# Patient Record
Sex: Female | Born: 1985 | Race: White | Hispanic: No | Marital: Married | Smoking: Current every day smoker
Health system: Southern US, Community
[De-identification: ages and names within clinical notes are randomized; demographics above are authoritative.]

## PROBLEM LIST (undated history)

## (undated) DIAGNOSIS — N390 Urinary tract infection, site not specified: Secondary | ICD-10-CM

## (undated) DIAGNOSIS — F329 Major depressive disorder, single episode, unspecified: Secondary | ICD-10-CM

## (undated) DIAGNOSIS — Z8742 Personal history of other diseases of the female genital tract: Secondary | ICD-10-CM

## (undated) DIAGNOSIS — F32A Depression, unspecified: Secondary | ICD-10-CM

## (undated) HISTORY — DX: Urinary tract infection, site not specified: N39.0

## (undated) HISTORY — PX: WISDOM TOOTH EXTRACTION: SHX21

## (undated) HISTORY — DX: Depression, unspecified: F32.A

## (undated) HISTORY — DX: Major depressive disorder, single episode, unspecified: F32.9

## (undated) HISTORY — DX: Personal history of other diseases of the female genital tract: Z87.42

## (undated) HISTORY — PX: HAND SURGERY: SHX662

## (undated) HISTORY — PX: TONSILLECTOMY: SUR1361

---

## 2003-01-09 HISTORY — PX: HAND SURGERY: SHX662

## 2005-01-16 ENCOUNTER — Emergency Department (HOSPITAL_COMMUNITY): Admission: EM | Admit: 2005-01-16 | Discharge: 2005-01-16 | Payer: Self-pay | Admitting: Emergency Medicine

## 2007-01-09 HISTORY — PX: OTHER SURGICAL HISTORY: SHX169

## 2008-04-15 ENCOUNTER — Emergency Department (HOSPITAL_COMMUNITY): Admission: EM | Admit: 2008-04-15 | Discharge: 2008-04-15 | Payer: Self-pay | Admitting: Emergency Medicine

## 2008-07-17 ENCOUNTER — Emergency Department (HOSPITAL_COMMUNITY): Admission: EM | Admit: 2008-07-17 | Discharge: 2008-07-17 | Payer: Self-pay | Admitting: Emergency Medicine

## 2009-01-22 ENCOUNTER — Ambulatory Visit (HOSPITAL_BASED_OUTPATIENT_CLINIC_OR_DEPARTMENT_OTHER): Admission: RE | Admit: 2009-01-22 | Discharge: 2009-01-22 | Payer: Self-pay | Admitting: Nurse Practitioner

## 2009-01-22 ENCOUNTER — Ambulatory Visit: Payer: Self-pay | Admitting: Diagnostic Radiology

## 2009-03-31 ENCOUNTER — Emergency Department (HOSPITAL_BASED_OUTPATIENT_CLINIC_OR_DEPARTMENT_OTHER): Admission: EM | Admit: 2009-03-31 | Discharge: 2009-03-31 | Payer: Self-pay | Admitting: Emergency Medicine

## 2009-03-31 ENCOUNTER — Ambulatory Visit: Payer: Self-pay | Admitting: Radiology

## 2010-04-16 LAB — URINE CULTURE

## 2010-04-16 LAB — COMPREHENSIVE METABOLIC PANEL
BUN: 8 mg/dL (ref 6–23)
Calcium: 8.4 mg/dL (ref 8.4–10.5)
Creatinine, Ser: 0.82 mg/dL (ref 0.4–1.2)
Glucose, Bld: 105 mg/dL — ABNORMAL HIGH (ref 70–99)
Sodium: 140 mEq/L (ref 135–145)
Total Protein: 6.4 g/dL (ref 6.0–8.3)

## 2010-04-16 LAB — URINALYSIS, ROUTINE W REFLEX MICROSCOPIC
Specific Gravity, Urine: 1.025 (ref 1.005–1.030)
pH: 6.5 (ref 5.0–8.0)

## 2010-04-16 LAB — LIPASE, BLOOD: Lipase: 29 U/L (ref 11–59)

## 2010-04-16 LAB — URINE MICROSCOPIC-ADD ON

## 2010-04-17 LAB — HIV ANTIBODY (ROUTINE TESTING W REFLEX): HIV: NONREACTIVE

## 2010-04-17 LAB — ANTIBODY SCREEN: Antibody Screen: NEGATIVE

## 2010-04-17 LAB — GC/CHLAMYDIA PROBE AMP, GENITAL: Gonorrhea: NEGATIVE

## 2010-04-17 LAB — ABO/RH

## 2010-04-17 LAB — RPR: RPR: NONREACTIVE

## 2010-10-04 ENCOUNTER — Other Ambulatory Visit (HOSPITAL_COMMUNITY): Payer: Self-pay | Admitting: Obstetrics and Gynecology

## 2010-10-04 DIAGNOSIS — IMO0001 Reserved for inherently not codable concepts without codable children: Secondary | ICD-10-CM

## 2010-10-04 DIAGNOSIS — IMO0002 Reserved for concepts with insufficient information to code with codable children: Secondary | ICD-10-CM

## 2010-10-05 ENCOUNTER — Ambulatory Visit (HOSPITAL_COMMUNITY)
Admission: RE | Admit: 2010-10-05 | Discharge: 2010-10-05 | Disposition: A | Discharge status: Home / Self Care | Payer: Medicaid Other | Source: Ambulatory Visit | Attending: Obstetrics and Gynecology | Admitting: Obstetrics and Gynecology

## 2010-10-05 ENCOUNTER — Encounter (HOSPITAL_COMMUNITY): Payer: Self-pay

## 2010-10-05 ENCOUNTER — Inpatient Hospital Stay (HOSPITAL_COMMUNITY)
Admission: AD | Admit: 2010-10-05 | Discharge: 2010-10-05 | Disposition: A | Discharge status: Home / Self Care | Payer: Medicaid Other | Source: Ambulatory Visit | Attending: Obstetrics and Gynecology | Admitting: Obstetrics and Gynecology

## 2010-10-05 ENCOUNTER — Other Ambulatory Visit (HOSPITAL_COMMUNITY): Payer: Self-pay | Admitting: Obstetrics and Gynecology

## 2010-10-05 DIAGNOSIS — IMO0002 Reserved for concepts with insufficient information to code with codable children: Secondary | ICD-10-CM

## 2010-10-05 DIAGNOSIS — O365990 Maternal care for other known or suspected poor fetal growth, unspecified trimester, not applicable or unspecified: Secondary | ICD-10-CM | POA: Insufficient documentation

## 2010-10-05 DIAGNOSIS — IMO0001 Reserved for inherently not codable concepts without codable children: Secondary | ICD-10-CM

## 2010-10-05 DIAGNOSIS — O30009 Twin pregnancy, unspecified number of placenta and unspecified number of amniotic sacs, unspecified trimester: Secondary | ICD-10-CM | POA: Insufficient documentation

## 2010-10-05 DIAGNOSIS — O99330 Smoking (tobacco) complicating pregnancy, unspecified trimester: Secondary | ICD-10-CM | POA: Insufficient documentation

## 2010-10-05 NOTE — Discharge Instructions (Signed)
Preterm Labor, Home Care  °Preterm labor is defined as having uterine contractions that cause the cervix to open (dilate), shorten and thin (effacement) before completing 37 weeks of pregnancy. Preterm labor accounts for most hospital admissions in pregnant women.  °CAUSES °· Most cases of preterm labor are unknown.  °· Small areas of separation of the placenta (abruption).  °· Excess fluid in the amniotic sac (poly hydramnios).  °· Twins or more.  °· The cervix cannot hold the baby because the tissue in the cervix is too weak (incompetent cervix).  °· Hormone changes.  °· Vaginal bleeding in more than one of the trimesters.  °· Infection of the cervix, vagina or bladder.  °· Smoking.  °· Antiphosolipid Syndrome. This happens when antibodies affect the protein in the body.  °DIAGNOSIS °Factors that help predict preterm labor: °· History of preterm labor with a past pregnancy.  °· Bacterial vaginosis in women who previously had preterm labor.  °· Home uterine activity monitoring that show uterine contractions.  °· Fetal fibronectin protein that is elevated in women with previous history of preterm labor.  °· Ultrasound to measure the length of the cervix, if it shows signs of shortening before the due date, it may be a sign of preterm labor.  °· Using the fibronectin and cervical ultrasound evaluation together is more predictive of impending preterm labor.  °· Other risk factors include:  °· Nonwhite race.  °· Pregnancy in a 17 year old or younger.  °· Pregnancy in a 35 year old or older.  °· Low socioeconomic factors.  °· Low weight gain during the pregnancy.  °PREVENTION °Not all preterm labor can be prevented. Some early contractions can be prevented with simple measures. °· Drink fluids. Drink eight, 8 ounce glasses of fluids per day. Preterm labor rates go up in the summer months. Dehydration makes the blood volume decrease. This increases the concentration of oxytocin (hormone that causes uterine contractions)  in the blood. Hydrating yourself helps prevent this build up.  °· Watch for signs of infection. Signs include burning during urination, increased need to urinate, abnormal vaginal discharge or unexplained fevers.  °· Keep your appointments with your caregiver. Call your caregiver right away if you think you are having uterine contractions.  °· Seek medical advice with questions or problems. It is much better to ask questions of your caregiver than to be in untreated preterm labor unknowingly.  °MANAGEMENT OF PRETERM LABOR, IN & OUT OF THE HOSPITAL °There are a lot of things to manage in preterm labor. These things include both medical measures and personal care measures for you and/or your baby. Most preterm labor will be handled in the hospital. Things that may be helpful in preterm labor include: °· Hydration (oral or IV). Take in eight, 8 ounce glasses of water per day.  °· Bed rest (home or hospital). Lying on your left side may help.  °· Avoid intercourse and orgasms.  °· Medication (antibiotics) to help prevent infection. This is more likely if your membranes have ruptured or if the contractions are caused by infection. Take medications as directed.  °· Evaluation of your baby. These tests or procedures help the caregiver know how the baby is doing and may do in the case of an early birth. Including:  °· Biophysical profile.  °· Non-stress or stress tests.  °· Amniocentesis to evaluate the baby for fetal lung maturity.  °· Amniotic fluid volume index (AFI).  °· An ultrasound.  °· Medications (steroids) to   help your baby's lungs mature more quickly may be used. This may happen if preterm birth cannot be stopped.  °· Tocolytic medications (medications that help stop uterine contractions) may help prolong the pregnancy up to 7 days. This is helpful if steroids medication is needed to help the baby’s lungs mature.  °· Your caregiver may give other advice on preparation for preterm birth.  °· Progesterone may be  beneficial in some cases of preterm labor.  °TREATMENT °The best treatment is prevention, being aware of risk factors and early detection. Make sure to ask your caregiver to discuss with you the signs and symptoms of preterm labor, especially if you had preterm labor with a previous pregnancy. °HOME CARE INSTRUCTIONS °· Eat a balanced and nourished diet.  °· Take your vitamin supplements as directed.  °· Drink 6 to 8 glasses of liquids a day.  °· Get plenty of rest and sleep.  °· Do not have sexual relations if you have preterm labor or are at high risk of having preterm labor.  °· Follow your care giver’s recommendation regarding activities, medications, blood and other tests (ultrasound, amniocentesis, etc.).  °· Avoid stress.  °· Avoid hard labor or prolonged exercise if you are at high risk for preterm labor.  °· Do not smoke.  °SEEK IMMEDIATE MEDICAL CARE IF: °· You are having contractions.  °· You have abdominal pain.  °· You have vaginal bleeding.  °· You have painful urination.  °· You have abnormal discharge.  °· You develop a temperature 102° F (38.9° C) or higher.  °Document Released: 12/25/2004 Document Re-Released: 03/21/2009 °[employer]® Patient Information ©2011 [employer]. ° ° °

## 2010-10-05 NOTE — Progress Notes (Signed)
Patient seen for ultrasound appointment today.  Please see [facility] report for details.  

## 2010-10-09 ENCOUNTER — Ambulatory Visit (HOSPITAL_COMMUNITY)
Admission: RE | Admit: 2010-10-09 | Discharge: 2010-10-09 | Disposition: A | Discharge status: Home / Self Care | Payer: Medicaid Other | Source: Ambulatory Visit | Attending: Obstetrics and Gynecology | Admitting: Obstetrics and Gynecology

## 2010-10-09 DIAGNOSIS — O365990 Maternal care for other known or suspected poor fetal growth, unspecified trimester, not applicable or unspecified: Secondary | ICD-10-CM | POA: Insufficient documentation

## 2010-10-09 DIAGNOSIS — O30049 Twin pregnancy, dichorionic/diamniotic, unspecified trimester: Secondary | ICD-10-CM | POA: Insufficient documentation

## 2010-10-09 DIAGNOSIS — IMO0002 Reserved for concepts with insufficient information to code with codable children: Secondary | ICD-10-CM

## 2010-10-09 DIAGNOSIS — O30009 Twin pregnancy, unspecified number of placenta and unspecified number of amniotic sacs, unspecified trimester: Secondary | ICD-10-CM | POA: Insufficient documentation

## 2010-10-09 DIAGNOSIS — O99330 Smoking (tobacco) complicating pregnancy, unspecified trimester: Secondary | ICD-10-CM | POA: Insufficient documentation

## 2010-10-09 NOTE — Progress Notes (Signed)
Encounter addended by: Marlana Latus, RN on: 10/09/2010 11:51 AM<BR>     Documentation filed: Normajean Glasgow VN

## 2010-10-09 NOTE — Progress Notes (Signed)
Twins:  A - NST reactive B - NST reactive

## 2010-10-09 NOTE — Progress Notes (Signed)
Ultrasound in [facility]/EPIC.  Follow up U/S scheduled 

## 2010-10-09 NOTE — Progress Notes (Signed)
Encounter addended by: Marlana Latus, RN on: 10/09/2010 11:18 AM<BR>     Documentation filed: Charges VN

## 2010-10-10 ENCOUNTER — Telehealth (HOSPITAL_COMMUNITY): Payer: Self-pay | Admitting: *Deleted

## 2010-10-10 ENCOUNTER — Encounter (HOSPITAL_COMMUNITY): Payer: Self-pay | Admitting: *Deleted

## 2010-10-10 NOTE — Telephone Encounter (Signed)
Preadmission screen  

## 2010-10-12 ENCOUNTER — Inpatient Hospital Stay (HOSPITAL_COMMUNITY): Payer: Medicaid Other

## 2010-10-12 ENCOUNTER — Other Ambulatory Visit: Payer: Self-pay | Admitting: Obstetrics and Gynecology

## 2010-10-12 ENCOUNTER — Encounter (HOSPITAL_COMMUNITY): Payer: Self-pay

## 2010-10-12 ENCOUNTER — Encounter (HOSPITAL_COMMUNITY)
Admission: RE | Disposition: A | Discharge status: Home / Self Care | Payer: Self-pay | Source: Ambulatory Visit | Attending: Obstetrics and Gynecology

## 2010-10-12 ENCOUNTER — Inpatient Hospital Stay (HOSPITAL_COMMUNITY)
Admission: RE | Admit: 2010-10-12 | Discharge: 2010-10-15 | DRG: 765 | Disposition: A | Discharge status: Home / Self Care | Payer: Medicaid Other | Source: Ambulatory Visit | Attending: Obstetrics and Gynecology | Admitting: Obstetrics and Gynecology

## 2010-10-12 ENCOUNTER — Inpatient Hospital Stay (HOSPITAL_COMMUNITY): Payer: Medicaid Other | Admitting: Anesthesiology

## 2010-10-12 ENCOUNTER — Encounter (HOSPITAL_COMMUNITY): Payer: Self-pay | Admitting: Anesthesiology

## 2010-10-12 ENCOUNTER — Ambulatory Visit (HOSPITAL_COMMUNITY): Payer: Medicaid Other

## 2010-10-12 DIAGNOSIS — O30049 Twin pregnancy, dichorionic/diamniotic, unspecified trimester: Secondary | ICD-10-CM

## 2010-10-12 DIAGNOSIS — IMO0002 Reserved for concepts with insufficient information to code with codable children: Secondary | ICD-10-CM

## 2010-10-12 DIAGNOSIS — O3090 Multiple gestation, unspecified, unspecified trimester: Principal | ICD-10-CM | POA: Diagnosis present

## 2010-10-12 DIAGNOSIS — O368190 Decreased fetal movements, unspecified trimester, not applicable or unspecified: Secondary | ICD-10-CM | POA: Diagnosis present

## 2010-10-12 DIAGNOSIS — O30009 Twin pregnancy, unspecified number of placenta and unspecified number of amniotic sacs, unspecified trimester: Secondary | ICD-10-CM | POA: Diagnosis present

## 2010-10-12 DIAGNOSIS — O365990 Maternal care for other known or suspected poor fetal growth, unspecified trimester, not applicable or unspecified: Secondary | ICD-10-CM | POA: Diagnosis present

## 2010-10-12 LAB — CBC
HCT: 39.5 % (ref 36.0–46.0)
MCHC: 34.4 g/dL (ref 30.0–36.0)
RDW: 14.1 % (ref 11.5–15.5)

## 2010-10-12 SURGERY — Surgical Case
Anesthesia: Spinal | Site: Abdomen | Wound class: Clean Contaminated

## 2010-10-12 MED ORDER — ONDANSETRON HCL 4 MG/2ML IJ SOLN
INTRAMUSCULAR | Status: DC | PRN
  Administered 2010-10-12: 4 mg via INTRAVENOUS

## 2010-10-12 MED ORDER — CEFAZOLIN SODIUM 1-5 GM-% IV SOLN
INTRAVENOUS | Status: AC
  Filled 2010-10-12: qty 100

## 2010-10-12 MED ORDER — ONDANSETRON HCL 4 MG/2ML IJ SOLN: 4.0000 mg | Freq: Four times a day (QID) | INTRAMUSCULAR | Status: DC | PRN

## 2010-10-12 MED ORDER — LACTATED RINGERS IV SOLN
INTRAVENOUS | Status: DC
  Administered 2010-10-12 (×3): via INTRAVENOUS
  Administered 2010-10-13: 1000 mL via INTRAVENOUS

## 2010-10-12 MED ORDER — FLEET ENEMA 7-19 GM/118ML RE ENEM: 1.0000 | ENEMA | RECTAL | Status: DC | PRN

## 2010-10-12 MED ORDER — PHENYLEPHRINE HCL 10 MG/ML IJ SOLN
INTRAMUSCULAR | Status: DC | PRN
  Administered 2010-10-12 (×5): 80 ug via INTRAVENOUS

## 2010-10-12 MED ORDER — OXYTOCIN 20 UNITS IN LACTATED RINGERS INFUSION - SIMPLE: 125.0000 mL/h | Freq: Once | INTRAVENOUS | Status: DC

## 2010-10-12 MED ORDER — CEFAZOLIN SODIUM 1-5 GM-% IV SOLN
INTRAVENOUS | Status: DC | PRN
  Administered 2010-10-12: 2 g via INTRAVENOUS

## 2010-10-12 MED ORDER — FENTANYL CITRATE 0.05 MG/ML IJ SOLN
INTRAMUSCULAR | Status: DC | PRN
  Administered 2010-10-12: 15 ug via INTRATHECAL

## 2010-10-12 MED ORDER — BUPIVACAINE IN DEXTROSE 0.75-8.25 % IT SOLN
INTRATHECAL | Status: DC | PRN
  Administered 2010-10-12: 1.5 mL via INTRATHECAL

## 2010-10-12 MED ORDER — LIDOCAINE HCL (PF) 1 % IJ SOLN
30.0000 mL | INTRAMUSCULAR | Status: DC | PRN
  Filled 2010-10-12: qty 30

## 2010-10-12 MED ORDER — MEPERIDINE HCL 25 MG/ML IJ SOLN
INTRAMUSCULAR | Status: DC | PRN
  Administered 2010-10-12 (×3): 6 mg via INTRAVENOUS
  Administered 2010-10-12: 7 mg via INTRAVENOUS

## 2010-10-12 MED ORDER — MORPHINE SULFATE (PF) 0.5 MG/ML IJ SOLN
INTRAMUSCULAR | Status: DC | PRN
  Administered 2010-10-12: .9 mg via EPIDURAL
  Administered 2010-10-12 – 2010-10-13 (×4): 1 mg via EPIDURAL

## 2010-10-12 MED ORDER — OXYCODONE-ACETAMINOPHEN 5-325 MG PO TABS
2.0000 | ORAL_TABLET | ORAL | Status: DC | PRN
  Administered 2010-10-13 – 2010-10-15 (×11): 2 via ORAL
  Filled 2010-10-12 (×13): qty 2

## 2010-10-12 MED ORDER — MEPERIDINE HCL 25 MG/ML IJ SOLN
INTRAMUSCULAR | Status: AC
  Filled 2010-10-12: qty 1

## 2010-10-12 MED ORDER — ACETAMINOPHEN 325 MG PO TABS: 650.0000 mg | ORAL_TABLET | ORAL | Status: DC | PRN

## 2010-10-12 MED ORDER — OXYTOCIN 10 UNIT/ML IJ SOLN
INTRAMUSCULAR | Status: AC
  Filled 2010-10-12: qty 4

## 2010-10-12 MED ORDER — CITRIC ACID-SODIUM CITRATE 334-500 MG/5ML PO SOLN
30.0000 mL | ORAL | Status: DC | PRN
  Administered 2010-10-12: 30 mL via ORAL
  Filled 2010-10-12: qty 15

## 2010-10-12 MED ORDER — MORPHINE SULFATE (PF) 0.5 MG/ML IJ SOLN
INTRAMUSCULAR | Status: DC | PRN
  Administered 2010-10-12: .1 mg via INTRATHECAL

## 2010-10-12 MED ORDER — LACTATED RINGERS IV SOLN: 500.0000 mL | INTRAVENOUS | Status: DC | PRN

## 2010-10-12 MED ORDER — OXYTOCIN 20 UNITS IN LACTATED RINGERS INFUSION - SIMPLE
INTRAVENOUS | Status: DC | PRN
  Administered 2010-10-12 (×2): 20 [IU] via INTRAVENOUS

## 2010-10-12 MED ORDER — MORPHINE SULFATE 0.5 MG/ML IJ SOLN
INTRAMUSCULAR | Status: AC
  Filled 2010-10-12: qty 10

## 2010-10-12 MED ORDER — ONDANSETRON HCL 4 MG/2ML IJ SOLN
INTRAMUSCULAR | Status: AC
  Filled 2010-10-12: qty 2

## 2010-10-12 MED ORDER — FENTANYL CITRATE 0.05 MG/ML IJ SOLN
INTRAMUSCULAR | Status: AC
  Filled 2010-10-12: qty 2

## 2010-10-12 MED ORDER — FENTANYL CITRATE 0.05 MG/ML IJ SOLN
INTRAMUSCULAR | Status: DC | PRN
  Administered 2010-10-12: 35 ug via INTRAVENOUS
  Administered 2010-10-12 (×2): 25 ug via INTRAVENOUS

## 2010-10-12 MED ORDER — IBUPROFEN 600 MG PO TABS
600.0000 mg | ORAL_TABLET | Freq: Four times a day (QID) | ORAL | Status: DC | PRN
  Filled 2010-10-12 (×4): qty 1

## 2010-10-12 MED ORDER — OXYTOCIN BOLUS FROM INFUSION
500.0000 mL | Freq: Once | INTRAVENOUS | Status: DC
  Filled 2010-10-12: qty 500

## 2010-10-12 SURGICAL SUPPLY — 40 items
ADH SKN CLS APL DERMABOND .7 (GAUZE/BANDAGES/DRESSINGS)
CLOTH BEACON ORANGE TIMEOUT ST (SAFETY) ×2 IMPLANT
DERMABOND ADVANCED (GAUZE/BANDAGES/DRESSINGS)
DERMABOND ADVANCED .7 DNX12 (GAUZE/BANDAGES/DRESSINGS) IMPLANT
DRESSING TELFA 8X3 (GAUZE/BANDAGES/DRESSINGS) ×1 IMPLANT
DURAPREP 26ML APPLICATOR (WOUND CARE) ×2 IMPLANT
ELECT REM PT RETURN 9FT ADLT (ELECTROSURGICAL) ×2
ELECTRODE REM PT RTRN 9FT ADLT (ELECTROSURGICAL) ×1 IMPLANT
EXTRACTOR VACUUM M CUP 4 TUBE (SUCTIONS) IMPLANT
GAUZE SPONGE 4X4 12PLY STRL LF (GAUZE/BANDAGES/DRESSINGS) IMPLANT
GLOVE BIO SURGEON STRL SZ 6.5 (GLOVE) ×2 IMPLANT
GLOVE BIO SURGEON STRL SZ7 (GLOVE) ×1 IMPLANT
GLOVE BIOGEL PI IND STRL 7.0 (GLOVE) ×1 IMPLANT
GLOVE BIOGEL PI IND STRL 7.5 (GLOVE) IMPLANT
GLOVE BIOGEL PI IND STRL 8.5 (GLOVE) IMPLANT
GLOVE BIOGEL PI INDICATOR 7.0 (GLOVE) ×1
GLOVE BIOGEL PI INDICATOR 7.5 (GLOVE) ×1
GLOVE BIOGEL PI INDICATOR 8.5 (GLOVE) ×1
GLOVE INDICATOR 7.0 STRL GRN (GLOVE) ×1 IMPLANT
GOWN PREVENTION PLUS LG XLONG (DISPOSABLE) ×4 IMPLANT
GOWN PREVENTION PLUS XLARGE (GOWN DISPOSABLE) ×2 IMPLANT
KIT ABG SYR 3ML LUER SLIP (SYRINGE) IMPLANT
NDL HYPO 25X5/8 SAFETYGLIDE (NEEDLE) IMPLANT
NEEDLE HYPO 25X5/8 SAFETYGLIDE (NEEDLE) IMPLANT
NS IRRIG 1000ML POUR BTL (IV SOLUTION) ×3 IMPLANT
PACK C SECTION WH (CUSTOM PROCEDURE TRAY) ×2 IMPLANT
PAD ABD 7.5X8 STRL (GAUZE/BANDAGES/DRESSINGS) ×1 IMPLANT
RTRCTR C-SECT PINK 25CM LRG (MISCELLANEOUS) ×1 IMPLANT
SLEEVE SCD COMPRESS KNEE MED (MISCELLANEOUS) ×1 IMPLANT
SPONGE GAUZE 4X4 12PLY (GAUZE/BANDAGES/DRESSINGS) ×1 IMPLANT
SUT CHROMIC 2 0 CT 1 (SUTURE) ×3 IMPLANT
SUT PLAIN 2 0 (SUTURE)
SUT PLAIN 2 0 XLH (SUTURE) ×2 IMPLANT
SUT PLAIN ABS 2-0 54XMFL TIE (SUTURE) IMPLANT
SUT VIC AB 0 CT1 36 (SUTURE) ×8 IMPLANT
SUT VIC AB 4-0 KS 27 (SUTURE) ×2 IMPLANT
TAPE CLOTH SURG 4X10 WHT LF (GAUZE/BANDAGES/DRESSINGS) ×1 IMPLANT
TOWEL OR 17X24 6PK STRL BLUE (TOWEL DISPOSABLE) ×4 IMPLANT
TRAY FOLEY CATH 14FR (SET/KITS/TRAYS/PACK) ×1 IMPLANT
WATER STERILE IRR 1000ML POUR (IV SOLUTION) ×2 IMPLANT

## 2010-10-12 NOTE — Plan of Care (Signed)
Pt is waiting on induction of labor in birthing suites.

## 2010-10-12 NOTE — Progress Notes (Signed)
Pt is scheduled for an induction of labor. Unable to get ot BS at this time. Pt is very concerned about her babies(twins) and brought into [facility] for NST>

## 2010-10-12 NOTE — Progress Notes (Signed)
Dr. Neva Seat at bedside, discussing pt plan of care and option of vaginal delivery vs c-section due to baby B being breech. Pt states she feels more comfortable with proceeding with c-section. Dr. Neva Seat explaining risks and benefits of c-section. Pt verbalizes understanding and consents to c-section.

## 2010-10-12 NOTE — H&P (Addendum)
Nicole Bond is a 25 y.o. female presenting for medical  Induction of 36 wk twins, vertex/vertex and IUGR. The patient has had biweekly NSTs, weekly AFI and doppler studies. At 35 weeks IUGR of both twins was diagnosed. MFM consultation recommended delivery at 37weeks.  However after an abnormal doppler flow in one twin and decreased fetal movement, the decision was made for delivery. Risks possible complications of induction versus CS was discussed and informed consent was given. OB History    Grav Para Term Preterm Abortions TAB SAB Ect Mult Living   3 1 1  0 1 0 1 0 0 1     Past Medical History  Diagnosis Date  . No pertinent past medical history   . Depression   . History of ovarian cyst     left  . Frequent UTI    Past Surgical History  Procedure Date  . Tonsillectomy   . Hand surgery     cyst removed from left hand  . Wisdom tooth extraction   . Colon polyps extracted 2009  . Hand surgery 2005   Family History: family history includes Cancer in her paternal grandfather and paternal grandmother.  There is no history of Anesthesia problems, and Hypotension, and Malignant hyperthermia, and Pseudochol deficiency, . Social History:  reports that she has been smoking Cigarettes.  She has a 5 pack-year smoking history. She does not have any smokeless tobacco history on file. She reports that she does not drink alcohol or use illicit drugs.  ROS non contributory    Blood pressure 116/74, pulse 85, temperature 98.2 F (36.8 C), temperature source Oral, resp. rate 20, height 5\' 2"  (1.575 m), weight 104.327 kg (230 lb). Physical exam  Heent nl Heart S1 S2 clear Lungs clear abd term gravid abdomen cx 3 70/ -1 vtx Ext no swelling discoloration   Prenatal labs: ABO, Rh: A/Positive/-- (04/09 0000) Antibody: Negative (04/09 0000) Rubella:   RPR: Nonreactive (04/09 0000)  HBsAg: Negative (04/09 0000)  HIV: Non-reactive (04/09 0000)  GBS:     Assessment/Plan36 wks twins IUGR  P:   Induction of labor   Nicole Bond E 10/12/2010, 6:49 PM    Upon admission an ultrasound was ordered to confirm position vtx/vtx.  US revealed that the presentation was now vtx/breech. In light of this finding a consultion was held with the parents and they requested Cesarean section after the risks and benefits of vaginal birth vs Cesarean section were discussed and informed consent was given.

## 2010-10-12 NOTE — Anesthesia Procedure Notes (Signed)
Spinal Block  Patient location during procedure: OR Start time: 10/12/2010 11:09 PM Staffing Performed by: anesthesiologist  Preanesthetic Checklist Completed: patient identified, site marked, surgical consent, pre-op evaluation, timeout performed, IV checked, risks and benefits discussed and monitors and equipment checked Spinal Block Patient position: sitting Prep: site prepped and draped and DuraPrep Patient monitoring: heart rate, cardiac monitor, continuous pulse ox and blood pressure Approach: midline Location: L3-4 Injection technique: single-shot Needle Needle type: Sprotte  Needle gauge: 24 G Needle length: 9 cm Assessment Sensory level: T4

## 2010-10-12 NOTE — Anesthesia Preprocedure Evaluation (Addendum)
Anesthesia Evaluation  Name, MR# and DOB Patient awake  General Assessment Comment  Reviewed: Allergy & Precautions, H&P , NPO status , Patient's Chart, lab work & pertinent test results, reviewed documented beta blocker date and time   History of Anesthesia Complications Negative for: history of anesthetic complications  Airway Mallampati: II TM Distance: >3 FB Neck ROM: full    Dental  (+) Missing, Partial Upper, Poor Dentition and Partial Lower   Pulmonary Current Smoker  clear to auscultation        Cardiovascular regular Normal    Neuro/Psych PSYCHIATRIC DISORDERS (depression) Depression Negative Neurological ROS     GI/Hepatic Neg liver ROS  GERD   Endo/Other  Morbid obesity  Renal/GU negative Renal ROS  Female GU complaint (frequent uti)     Musculoskeletal   Abdominal   Peds  Hematology negative hematology ROS (+)   Anesthesia Other Findings   Reproductive/Obstetrics (+) Pregnancy (twins - vtx/breech, both IUGR)                        Anesthesia Physical Anesthesia Plan  ASA: II  Anesthesia Plan: Spinal   Post-op Pain Management:    Induction:   Airway Management Planned:   Additional Equipment:   Intra-op Plan:   Post-operative Plan:   Informed Consent: I have reviewed the patients History and Physical, chart, labs and discussed the procedure including the risks, benefits and alternatives for the proposed anesthesia with the patient or authorized representative who has indicated his/her understanding and acceptance.   Dental Advisory Given  Plan Discussed with: CRNA and Surgeon  Anesthesia Plan Comments:         Anesthesia Quick Evaluation

## 2010-10-13 LAB — CBC
MCH: 30.8 pg (ref 26.0–34.0)
MCHC: 34.6 g/dL (ref 30.0–36.0)
MCV: 89 fL (ref 78.0–100.0)
Platelets: 230 10*3/uL (ref 150–400)

## 2010-10-13 LAB — RPR: RPR Ser Ql: NONREACTIVE

## 2010-10-13 MED ORDER — NALBUPHINE SYRINGE 5 MG/0.5 ML
5.0000 mg | INJECTION | INTRAMUSCULAR | Status: DC | PRN
  Filled 2010-10-13: qty 1

## 2010-10-13 MED ORDER — MEPERIDINE HCL 25 MG/ML IJ SOLN
6.2500 mg | INTRAMUSCULAR | Status: AC | PRN
  Administered 2010-10-13 (×2): 6.25 mg via INTRAVENOUS

## 2010-10-13 MED ORDER — DIPHENHYDRAMINE HCL 25 MG PO CAPS: 25.0000 mg | ORAL_CAPSULE | ORAL | Status: DC | PRN

## 2010-10-13 MED ORDER — SIMETHICONE 80 MG PO CHEW
80.0000 mg | CHEWABLE_TABLET | Freq: Three times a day (TID) | ORAL | Status: DC
  Administered 2010-10-13 – 2010-10-15 (×7): 80 mg via ORAL

## 2010-10-13 MED ORDER — INFLUENZA VIRUS VACC SPLIT PF IM SUSP
0.5000 mL | Freq: Once | INTRAMUSCULAR | Status: AC
  Administered 2010-10-13: 0.5 mL via INTRAMUSCULAR
  Filled 2010-10-13: qty 0.5

## 2010-10-13 MED ORDER — PHENYLEPHRINE 40 MCG/ML (10ML) SYRINGE FOR IV PUSH (FOR BLOOD PRESSURE SUPPORT)
PREFILLED_SYRINGE | INTRAVENOUS | Status: AC
  Filled 2010-10-13: qty 5

## 2010-10-13 MED ORDER — IBUPROFEN 600 MG PO TABS
600.0000 mg | ORAL_TABLET | Freq: Four times a day (QID) | ORAL | Status: DC
  Administered 2010-10-13 – 2010-10-15 (×10): 600 mg via ORAL
  Filled 2010-10-13 (×7): qty 1

## 2010-10-13 MED ORDER — LANOLIN HYDROUS EX OINT: 1.0000 "application " | TOPICAL_OINTMENT | CUTANEOUS | Status: DC | PRN

## 2010-10-13 MED ORDER — ZOLPIDEM TARTRATE 5 MG PO TABS: 5.0000 mg | ORAL_TABLET | Freq: Every evening | ORAL | Status: DC | PRN

## 2010-10-13 MED ORDER — HYDROMORPHONE HCL 1 MG/ML IJ SOLN: 0.2500 mg | INTRAMUSCULAR | Status: DC | PRN

## 2010-10-13 MED ORDER — ONDANSETRON HCL 4 MG/2ML IJ SOLN: 4.0000 mg | Freq: Three times a day (TID) | INTRAMUSCULAR | Status: DC | PRN

## 2010-10-13 MED ORDER — TETANUS-DIPHTH-ACELL PERTUSSIS 5-2.5-18.5 LF-MCG/0.5 IM SUSP
0.5000 mL | Freq: Once | INTRAMUSCULAR | Status: AC
  Administered 2010-10-13: 0.5 mL via INTRAMUSCULAR
  Filled 2010-10-13: qty 0.5

## 2010-10-13 MED ORDER — SENNOSIDES-DOCUSATE SODIUM 8.6-50 MG PO TABS
2.0000 | ORAL_TABLET | Freq: Every day | ORAL | Status: DC
  Administered 2010-10-13 – 2010-10-14 (×2): 2 via ORAL

## 2010-10-13 MED ORDER — SIMETHICONE 80 MG PO CHEW: 80.0000 mg | CHEWABLE_TABLET | ORAL | Status: DC | PRN

## 2010-10-13 MED ORDER — MEDROXYPROGESTERONE ACETATE 150 MG/ML IM SUSP: 150.0000 mg | INTRAMUSCULAR | Status: DC | PRN

## 2010-10-13 MED ORDER — KETOROLAC TROMETHAMINE 30 MG/ML IJ SOLN: 30.0000 mg | Freq: Four times a day (QID) | INTRAMUSCULAR | Status: AC | PRN

## 2010-10-13 MED ORDER — DIBUCAINE 1 % RE OINT: 1.0000 "application " | TOPICAL_OINTMENT | RECTAL | Status: DC | PRN

## 2010-10-13 MED ORDER — MENTHOL 3 MG MT LOZG: 1.0000 | LOZENGE | OROMUCOSAL | Status: DC | PRN

## 2010-10-13 MED ORDER — METOCLOPRAMIDE HCL 5 MG/ML IJ SOLN: 10.0000 mg | Freq: Three times a day (TID) | INTRAMUSCULAR | Status: DC | PRN

## 2010-10-13 MED ORDER — LACTATED RINGERS IV SOLN
INTRAVENOUS | Status: DC
  Administered 2010-10-13: 06:00:00 via INTRAVENOUS

## 2010-10-13 MED ORDER — KETOROLAC TROMETHAMINE 60 MG/2ML IM SOLN
60.0000 mg | Freq: Once | INTRAMUSCULAR | Status: AC | PRN
  Administered 2010-10-13: 60 mg via INTRAMUSCULAR

## 2010-10-13 MED ORDER — SODIUM CHLORIDE 0.9 % IV SOLN
1.0000 ug/kg/h | INTRAVENOUS | Status: DC | PRN
  Filled 2010-10-13: qty 2.5

## 2010-10-13 MED ORDER — DIPHENHYDRAMINE HCL 25 MG PO CAPS: 25.0000 mg | ORAL_CAPSULE | Freq: Four times a day (QID) | ORAL | Status: DC | PRN

## 2010-10-13 MED ORDER — MEPERIDINE HCL 25 MG/ML IJ SOLN
INTRAMUSCULAR | Status: AC
  Filled 2010-10-13: qty 1

## 2010-10-13 MED ORDER — OXYTOCIN 20 UNITS IN LACTATED RINGERS INFUSION - SIMPLE: 125.0000 mL/h | INTRAVENOUS | Status: AC

## 2010-10-13 MED ORDER — TETANUS-DIPHTH-ACELL PERTUSSIS 5-2.5-18.5 LF-MCG/0.5 IM SUSP: 0.5000 mL | Freq: Once | INTRAMUSCULAR | Status: DC

## 2010-10-13 MED ORDER — ONDANSETRON HCL 4 MG/2ML IJ SOLN: 4.0000 mg | INTRAMUSCULAR | Status: DC | PRN

## 2010-10-13 MED ORDER — ONDANSETRON HCL 4 MG PO TABS: 4.0000 mg | ORAL_TABLET | ORAL | Status: DC | PRN

## 2010-10-13 MED ORDER — SCOPOLAMINE 1 MG/3DAYS TD PT72
1.0000 | MEDICATED_PATCH | Freq: Once | TRANSDERMAL | Status: DC
  Administered 2010-10-13: 1.5 mg via TRANSDERMAL

## 2010-10-13 MED ORDER — NALOXONE HCL 0.4 MG/ML IJ SOLN: 0.4000 mg | INTRAMUSCULAR | Status: DC | PRN

## 2010-10-13 MED ORDER — SODIUM CHLORIDE 0.9 % IJ SOLN: 3.0000 mL | INTRAMUSCULAR | Status: DC | PRN

## 2010-10-13 MED ORDER — DIPHENHYDRAMINE HCL 50 MG/ML IJ SOLN: 25.0000 mg | INTRAMUSCULAR | Status: DC | PRN

## 2010-10-13 MED ORDER — PRENATAL PLUS 27-1 MG PO TABS
1.0000 | ORAL_TABLET | Freq: Every day | ORAL | Status: DC
  Administered 2010-10-13 – 2010-10-15 (×3): 1 via ORAL
  Filled 2010-10-13 (×3): qty 1

## 2010-10-13 MED ORDER — OXYCODONE-ACETAMINOPHEN 5-325 MG PO TABS
1.0000 | ORAL_TABLET | ORAL | Status: DC | PRN
  Administered 2010-10-14 (×2): 2 via ORAL

## 2010-10-13 MED ORDER — DIPHENHYDRAMINE HCL 50 MG/ML IJ SOLN: 12.5000 mg | INTRAMUSCULAR | Status: DC | PRN

## 2010-10-13 MED ORDER — OXYTOCIN 20 UNITS IN LACTATED RINGERS INFUSION - SIMPLE
INTRAVENOUS | Status: AC
  Filled 2010-10-13: qty 1000

## 2010-10-13 MED ORDER — IBUPROFEN 600 MG PO TABS: 600.0000 mg | ORAL_TABLET | Freq: Four times a day (QID) | ORAL | Status: DC | PRN

## 2010-10-13 MED ORDER — SCOPOLAMINE 1 MG/3DAYS TD PT72
MEDICATED_PATCH | TRANSDERMAL | Status: AC
  Filled 2010-10-13: qty 1

## 2010-10-13 MED ORDER — MEASLES, MUMPS & RUBELLA VAC ~~LOC~~ INJ: 0.5000 mL | INJECTION | Freq: Once | SUBCUTANEOUS | Status: DC

## 2010-10-13 MED ORDER — KETOROLAC TROMETHAMINE 60 MG/2ML IM SOLN
INTRAMUSCULAR | Status: AC
  Filled 2010-10-13: qty 2

## 2010-10-13 MED ORDER — WITCH HAZEL-GLYCERIN EX PADS: 1.0000 "application " | MEDICATED_PAD | CUTANEOUS | Status: DC | PRN

## 2010-10-13 NOTE — Progress Notes (Signed)
Subjective: Postpartum Day 1: Cesarean Delivery Patient reports tolerating PO.    Objective: Vital signs in last 24 hours: Temp:  [97.5 F (36.4 C)-99 F (37.2 C)] 98.7 F (37.1 C) (10/05 1411) Pulse Rate:  [65-90] 76  (10/05 1411) Resp:  [18-32] 18  (10/05 1411) BP: (92-132)/(30-87) 113/70 mmHg (10/05 1411) SpO2:  [96 %-100 %] 98 % (10/05 1205)  Physical Exam:  General: alert and no distress Lochia: appropriate Uterine Fundus: firm Incision: healing well DVT Evaluation: No evidence of DVT seen on physical exam.   Basename 10/13/10 0110 10/12/10 1810  HGB 10.9* 13.6  HCT 31.5* 39.5    Assessment/Plan: Status post Cesarean section. Doing well postoperatively.  Continue current care.  Nyasha Rahilly E 10/13/2010, 4:14 PM

## 2010-10-13 NOTE — Transfer of Care (Signed)
Immediate Anesthesia Transfer of Care Note  Patient: Nicole Bond  Procedure(s) Performed:  CESAREAN SECTION - Twins  Patient Location: PACU  Anesthesia Type: Spinal  Level of Consciousness: awake, oriented and patient cooperative  Airway & Oxygen Therapy: Patient Spontanous Breathing  Post-op Assessment: Report given to PACU RN  Post vital signs: Reviewed and stable  Complications: No apparent anesthesia complications

## 2010-10-13 NOTE — OR Nursing (Signed)
Times corrected due to RN out of town

## 2010-10-13 NOTE — Anesthesia Postprocedure Evaluation (Signed)
  Anesthesia Post-op Note  Patient: Nicole Bond  Procedure(s) Performed:  CESAREAN SECTION - Twins  Patient Location: Mother/Baby  Anesthesia Type: Spinal  Level of Consciousness: awake, alert  and oriented  Airway and Oxygen Therapy: Patient Spontanous Breathing  Post-op Pain: none  Post-op Assessment: Post-op Vital signs reviewed, Patient's Cardiovascular Status Stable, No headache, No backache, No residual numbness and No residual motor weakness  Post-op Vital Signs: Reviewed and stable  Complications: No apparent anesthesia complications

## 2010-10-13 NOTE — Progress Notes (Signed)
UR Chart review completed.  

## 2010-10-13 NOTE — Brief Op Note (Signed)
10/12/2010  12:10 AM  PATIENT:  Nicole Bond  25 y.o. female  PRE-OPERATIVE DIAGNOSIS:  Twins, vtx/Breech, IUGR  POST-OPERATIVE DIAGNOSIS:  Twins, VTX, Breech, IUGR  PROCEDURE:  Procedure(s):LTCS CESAREAN SECTION  SURGEON:  Surgeon(s): Fortino Sic, MD  PHYSICIAN ASSISTANT:   ASSISTANTS: Dr Randa Evens   ANESTHESIA:   spinal  OR FLUID I/O:  Total I/O In: 1400 [I.V.:1400] Out: 800 [Urine:100; Blood:700]  BLOOD ADMINISTERED:none  DRAINS: Urinary Catheter (Foley)   LOCAL MEDICATIONS USED:  NONE  SPECIMEN:  Source of Specimen:  placenta  DISPOSITION OF SPECIMEN:  PATHOLOGY  COUNTS:  YES  TOURNIQUET:  * No tourniquets in log *  DICTATION: .Dragon Dictation  PLAN OF CARE: Admit to inpatient   PATIENT DISPOSITION:  PACU - hemodynamically stable.   Delay start of Pharmacological VTE agent (>24hrs) due to surgical blood loss or risk of bleeding:  not applicable              Cesarean Section Procedure Note   Nicole Bond   10/12/2010  Indications: twins, vtx/breech, IUGR   Pre-operative Diagnosis: Twins, Breech.   Post-operative Diagnosis: Same   Surgeon: Fortino Sic  Assistants: Dr. Randa Evens  Anesthesia: spinal  Procedure Details:  The patient was seen in the Holding Room. The risks, benefits, complications, treatment options, and expected outcomes were discussed with the patient. The patient concurred with the proposed plan, giving informed consent. The patient was identified as Cytogeneticist and the procedure verified as C-Section Delivery. A Time Out was held and the above information confirmed.  After induction of anesthesia, the patient was draped and prepped in the usual sterile manner. A transverse incision was made and carried down through the subcutaneous tissue to the fascia. The fascial incision was made and extended transversely. The fascia was separated from the underlying rectus tissue superiorly and inferiorly. The peritoneum  was identified and entered. The peritoneal incision was extended longitudinally. The utero-vesical peritoneal reflection was incised transversely and the bladder flap was bluntly freed from the lower uterine segment. A low transverse uterine incision was made. Delivered from cephalic presentation was a 4lb 8 oz female living newborn  with Apgar scores of 9/10.was not sent. sent. The umbilical cord was clamped and cut cord. A sample was obtained for evaluation. The second sac was ruptured,.  Fluid was clear.  The feet were grasped and the infant was delivered with out difficultly.  The head was delivered using a [employer] System. Baby B was a live born female  weighing 4lb, 4oz and had Apgars of 9/10. The placenta was removed Intact and appeared normal.  The uterine incision was closed with running locked sutures of 1-0 vicryl.. A second imbricating layer of the same suture was placed.  Hemostasis was observed. The paracolic gutters were irrigated. The fascia was then reapproximated with running sutures of 1-0Vicryl. The subcuticular closure was performed using 2-0 vicryl. The skin was approximated with 40 vicryl in a subcuticular fashion. .  Instrument, sponge, and needle counts were correct prior the abdominal closure and were correct at the conclusion of the case.    Findings:   Twins, both female, viable   Estimated Blood Loss:  750cc  Total IV Fluids:  See anesthesia record   Urine Output: adequate  Specimens: Placenta Specimens    None       Complications: no complications  Disposition: PACU - hemodynamically stable.  Maternal Condition: stable   Baby condition / location:  nursery-stable    Signed: Surgeon(s): Lou Miner  Neva Seat, MD

## 2010-10-13 NOTE — Progress Notes (Signed)
Encounter addended by: Madison Hickman on: 10/13/2010 10:13 AM<BR>     Documentation filed: Notes Section

## 2010-10-13 NOTE — Anesthesia Postprocedure Evaluation (Signed)
Anesthesia Post Note  Patient: Nicole Bond  Procedure(s) Performed:  CESAREAN SECTION - Twins  Anesthesia type: Spinal  Patient location: PACU  Post pain: Pain level controlled  Post assessment: Post-op Vital signs reviewed  Last Vitals:  Filed Vitals:   10/13/10 0233  BP: 113/78  Pulse: 76  Temp: 97.5 F (36.4 C)  Resp: 18    Post vital signs: Reviewed  Level of consciousness: awake  Complications: No apparent anesthesia complications

## 2010-10-13 NOTE — Progress Notes (Signed)
Anesthesia called per patients request. . Pain is a 6 out of 10 at incision site.  Anesthesia order received to start percocet now.

## 2010-10-14 MED ORDER — BACITRACIN ZINC 500 UNIT/GM EX OINT
TOPICAL_OINTMENT | Freq: Two times a day (BID) | CUTANEOUS | Status: DC
  Administered 2010-10-14 – 2010-10-15 (×3): via TOPICAL
  Filled 2010-10-14: qty 15

## 2010-10-14 NOTE — Progress Notes (Signed)
POD/PPD #2, Primary LTCS for twins  Pt having increased pain with change in position or with standing up.  No N/V.  Tolerating regular diet.  Bleeding appropriate.  Is bottle feeding.  + flatus.  Desires COP's for contraception.  Main complaint is from eye; pt has a stye and a swollen right lower eyelid with a pustule.  Has been using warm compresses to no avail.  AFVSS.  T max 99. Right eye:  approx 3x3.5 cm furuncle c/w stye.  Swelling of inferior eyelid associated with this. Abd:  Appropriately tender.  Bandage removed.  Fundus 1 cm below umbilicus.   Incision:  C/d/i with steris and subq. Lower Ext:  Mild bilateral symmetric edema.  Neg homan's.  No evid DVT.  Post op crit  31.5.    A/P:  POD/PPD #2, s/p LTCS for twins.  Patient has not gotten up and oob to any significant degree.  Instructed to ambulate today several times.  Otherwise, routine care.    As for stye, will provide bacitracin ointment.  Pt instructed to use externally and avoid contact with the sclera/conjunctiva itself.  Pt may continue to use warm compresses as well.  If not improved, consider I&D tomorrow.

## 2010-10-15 NOTE — Progress Notes (Signed)
POD/PPD #3  Pt without complaints.  Tolerating regular diet.  Bleeding less and + flatus.  Has ambulated.  Furuncle of right inferior eyelid drained yesterday and patient states she is feeling relief from this.  Pain controlled, except last night when patient stopped narcotic pain meds and noted increase in pain; patient has restarted these.  AFVSS Fundus firm, below umbilicus Incision:  C/d/i with steris Lower Ext:  No evid dvt.  Mild-mod symmetric bilateral edema.  Neg homan's.  A/P: stable POD/PPD #3 s/p Primary LTCS for twins.  Patient has achieved postop milestones.  D/C home today.  Patient states she would like to start contraception at 6 wk visit.  Babies not discharged and will stay overnight; patient will board.  Will d/c home with motrin and percocet #30.

## 2010-10-15 NOTE — Discharge Summary (Signed)
Discharge summary dictated.  Identification number is [id number].

## 2010-10-16 NOTE — Discharge Summary (Signed)
Nicole Bond, Nicole Bond               ACCOUNT NO.:  [id number]  MEDICAL RECORD NO.:  [id number]  LOCATION:  9133                          FACILITY:  [facility]  PHYSICIAN:  Pricilla Holm, MD      DATE OF BIRTH:  09/20/1985  DATE OF ADMISSION:  10/12/2010 DATE OF DISCHARGE:  10/15/2010                              DISCHARGE SUMMARY   PRINCIPAL DIAGNOSES:  Pregnancy at 36 weeks, twin gestation, IUGR, cephalic breech presentation.  HISTORY OF PRESENT ILLNESS:  Please see dictated report for further details.  The patient presented at 36+ weeks with IUGR of her twin pregnancy.  The patient was followed with biweekly NSTs, BPPs, and Dopplers.  Given an abnormal Doppler of 1 twin and decreased fetal movement, the decision was made to proceed with delivery.  On presentation to the hospital, the patient was noted to be in vertex breech position.  After discussion with the patient, after which she was counseled about the risks and benefits of cesarean versus vaginal delivery, the patient opted for cesarean delivery.  HOSPITAL COURSE:  The patient was admitted and underwent a primary low transverse cesarean section on October 13, 2010.  Please see dictated report for further details.  The patient delivered 2 viable female infants without complication.  The patient's postoperative course was for the most part uncomplicated. With respect to her postoperative medicine, the patient began tolerating a regular diet and had passed flatus by postoperative day #2.  The patient was ambulating without difficulty.  The patient's pain was for the most part controlled.  She did wean herself off of narcotics and noted that her pain had increased and therefore started narcotic pain medications again.  The patient is bottle feeding.  During her postoperative course, the patient remained afebrile with stable vital signs.  The patient's physical exams were consistent with her recent postop stay.  The patient's incision  was noted to be clean, dry, and intact with Steri-Strips and underlying subcuticular stitch.  The patient's postoperative hematocrit was 31.  PRENATAL LABS:  A positive /rubella immune/RPR nonreactive.  Pap smear within normal limits.  The patient was deemed stable for discharge on October 15, 2010.  The patient's infants were to stay in the hospital and the patient is to remain in the hospital as a boarder. Prescriptions were written for Percocet #30 and Motrin #30 (600 mg).  The patient's family is to pick these up at the pharmacy and to bring them back for the patient to take while she is in the hospital with her infant's boarding.  The patient opted to wait for 6 weeks before restarting contraception.  I stated that we could write her a prescription at that time.  The patient had been on the oral contraceptive pill as well as NuvaRing.  Both are acceptable methods for her.  I stated that we would see the patient back in 1 week for an incision check and then 6 weeks for a routine post partum exam.  At the 1 week incision check, we can also check the patient's mood.  Prior to her delivery, especially in the last several weeks of her pregnancy, the patient seemed to perseverate over the health  and well-being of her child's and seemingly had a slightly depressed affect.  During the patient's hospital course, however, she seems to be appropriate.  We should also evaluate the patient's affect and discuss postpartum depression signs and symptoms with her at her postpartum visit.          ______________________________ Pricilla Holm, MD     RB/MEDQ  D:  10/15/2010  T:  10/15/2010  Job:  [id number]

## 2010-10-18 ENCOUNTER — Encounter (HOSPITAL_COMMUNITY): Payer: Self-pay | Admitting: Obstetrics and Gynecology

## 2011-01-12 ENCOUNTER — Emergency Department (HOSPITAL_BASED_OUTPATIENT_CLINIC_OR_DEPARTMENT_OTHER)
Admission: EM | Admit: 2011-01-12 | Discharge: 2011-01-12 | Disposition: A | Discharge status: Home / Self Care | Payer: Medicaid Other | Attending: Emergency Medicine | Admitting: Emergency Medicine

## 2011-01-12 ENCOUNTER — Encounter (HOSPITAL_BASED_OUTPATIENT_CLINIC_OR_DEPARTMENT_OTHER): Payer: Self-pay | Admitting: Family Medicine

## 2011-01-12 ENCOUNTER — Emergency Department (INDEPENDENT_AMBULATORY_CARE_PROVIDER_SITE_OTHER): Payer: Medicaid Other

## 2011-01-12 DIAGNOSIS — F0781 Postconcussional syndrome: Secondary | ICD-10-CM

## 2011-01-12 DIAGNOSIS — X58XXXA Exposure to other specified factors, initial encounter: Secondary | ICD-10-CM

## 2011-01-12 DIAGNOSIS — W208XXA Other cause of strike by thrown, projected or falling object, initial encounter: Secondary | ICD-10-CM | POA: Insufficient documentation

## 2011-01-12 DIAGNOSIS — R42 Dizziness and giddiness: Secondary | ICD-10-CM

## 2011-01-12 DIAGNOSIS — R51 Headache: Secondary | ICD-10-CM

## 2011-01-12 DIAGNOSIS — S0003XA Contusion of scalp, initial encounter: Secondary | ICD-10-CM | POA: Insufficient documentation

## 2011-01-12 DIAGNOSIS — S0093XA Contusion of unspecified part of head, initial encounter: Secondary | ICD-10-CM

## 2011-01-12 DIAGNOSIS — S0083XA Contusion of other part of head, initial encounter: Secondary | ICD-10-CM | POA: Insufficient documentation

## 2011-01-12 MED ORDER — HYDROCODONE-ACETAMINOPHEN 5-325 MG PO TABS
2.0000 | ORAL_TABLET | Freq: Once | ORAL | Status: AC
  Administered 2011-01-12: 2 via ORAL
  Filled 2011-01-12: qty 2

## 2011-01-12 MED ORDER — HYDROCODONE-ACETAMINOPHEN 5-325 MG PO TABS: 2.0000 | ORAL_TABLET | ORAL | Status: AC | PRN
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
## 2011-01-12 NOTE — ED Provider Notes (Signed)
History     CSN: [id number]  Arrival date & time 01/12/11  1731   First MD Initiated Contact with Patient 01/12/11 1808      Chief Complaint  Patient presents with  . Headache    (Consider location/radiation/quality/duration/timing/severity/associated sxs/prior treatment) Patient is a 26 y.o. female presenting with head injury. The history is provided by the patient. No language interpreter was used.  Head Injury  The incident occurred more than 2 days ago. She came to the ER via walk-in. The injury mechanism was a direct blow. She lost consciousness for a period of less than one minute. There was no blood loss. The quality of the pain is described as sharp. The pain is at a severity of 8/10. The pain is severe. The pain has been fluctuating since the injury. Pertinent negatives include no numbness, no blurred vision, no vomiting and no weakness. She has tried acetaminophen for the symptoms. The treatment provided no relief.  Pt reports she was bending down behind a truck to tie her shoes and tail gate fell hitting her in the head.  Pt reports blow knocked her down.  Pt reports loc less that a minute.  Pt complains of sharp shooting pain in head since.  Pt complains of being sensitive to light and sound  Past Medical History  Diagnosis Date  . Depression   . History of ovarian cyst     left  . Frequent UTI     Past Surgical History  Procedure Date  . Tonsillectomy   . Hand surgery     cyst removed from left hand  . Wisdom tooth extraction   . Colon polyps extracted 2009  . Hand surgery 2005  . Cesarean section 10/12/2010    Procedure: CESAREAN SECTION;  Surgeon: Fortino Sic, MD;  Location: [facility];  Service: Gynecology;  Laterality: N/A;  Twins    Family History  Problem Relation Age of Onset  . Cancer Paternal Grandmother     colon  . Cancer Paternal Grandfather     prostate  . Anesthesia problems Neg Hx   . Hypotension Neg Hx   . Malignant hyperthermia Neg Hx   .  Pseudochol deficiency Neg Hx     History  Substance Use Topics  . Smoking status: Current Everyday Smoker -- 0.5 packs/day for 10 years    Types: Cigarettes  . Smokeless tobacco: Not on file  . Alcohol Use: No    OB History    Grav Para Term Preterm Abortions TAB SAB Ect Mult Living   3 2 1 1 1  0 1 0 1 3      Review of Systems  Eyes: Negative for blurred vision.  Gastrointestinal: Negative for vomiting.  Neurological: Positive for dizziness. Negative for weakness and numbness.  All other systems reviewed and are negative.    Allergies  Review of patient's allergies indicates no known allergies.  Home Medications   Current Outpatient Rx  Name Route Sig Dispense Refill  . ACETAMINOPHEN 500 MG PO TABS Oral Take 1,000 mg by mouth every 6 (six) hours as needed. For pain     . IBUPROFEN 800 MG PO TABS Oral Take 800 mg by mouth every 8 (eight) hours as needed. For pain       BP 135/95  Pulse 83  Temp 98.8 F (37.1 C)  Resp 16  SpO2 100%  LMP 01/09/2011  Breastfeeding? No  Physical Exam  Vitals reviewed. Constitutional: She appears well-developed and well-nourished.  Tender area left parietal scalp,    HENT:  Head: Normocephalic and atraumatic.  Right Ear: External ear normal.  Left Ear: External ear normal.  Mouth/Throat: Oropharynx is clear and moist.  Eyes: Conjunctivae are normal. Pupils are equal, round, and reactive to light.  Neck: Normal range of motion. Neck supple.  Cardiovascular: Normal rate and normal heart sounds.   Pulmonary/Chest: Effort normal.  Musculoskeletal: Normal range of motion.  Neurological: She is alert.  Skin: Skin is warm.  Psychiatric: She has a normal mood and affect.    ED Course  Procedures (including critical care time)  Labs Reviewed - No data to display Ct Head Wo Contrast  01/12/2011  *RADIOLOGY REPORT*  Clinical Data:  Dizziness since a head injury 2 days ago.  CT HEAD WITHOUT CONTRAST  Technique: Contiguous axial  images were obtained from the base of the skull through the vertex without contrast.  Comparison:  01/22/2009.  Findings:  Normal appearing cerebral hemispheres and posterior fossa structures.  Normal size and position of the ventricles.  No skull fracture, intracranial hemorrhage or paranasal sinus air/fluid levels.  IMPRESSION: Normal examination.  Original Report Authenticated By: Darrol Angel, M.D.     No diagnosis found.    MDM  Ct head obtained,  No skull fx,  No acute abn.   I counseled pt on concussion symptoms.  Pt given rx for pain medication.  Pt advised to see her MD for recheck next week.        Langston Masker, [place] 01/12/11 1951

## 2011-01-14 NOTE — ED Provider Notes (Signed)
History/physical exam/procedure(s) were performed by non-physician practitioner and as supervising physician I was immediately available for consultation/collaboration. I have reviewed all notes and am in agreement with care and plan.   Hilario Quarry, MD 01/14/11 [phone]

## 2011-06-19 ENCOUNTER — Emergency Department (HOSPITAL_BASED_OUTPATIENT_CLINIC_OR_DEPARTMENT_OTHER): Payer: Medicaid Other

## 2011-06-19 ENCOUNTER — Emergency Department (HOSPITAL_BASED_OUTPATIENT_CLINIC_OR_DEPARTMENT_OTHER)
Admission: EM | Admit: 2011-06-19 | Discharge: 2011-06-19 | Disposition: A | Discharge status: Home / Self Care | Payer: Medicaid Other | Attending: Emergency Medicine | Admitting: Emergency Medicine

## 2011-06-19 DIAGNOSIS — S8990XA Unspecified injury of unspecified lower leg, initial encounter: Secondary | ICD-10-CM | POA: Insufficient documentation

## 2011-06-19 DIAGNOSIS — S99921A Unspecified injury of right foot, initial encounter: Secondary | ICD-10-CM

## 2011-06-19 DIAGNOSIS — F329 Major depressive disorder, single episode, unspecified: Secondary | ICD-10-CM | POA: Insufficient documentation

## 2011-06-19 DIAGNOSIS — S99919A Unspecified injury of unspecified ankle, initial encounter: Secondary | ICD-10-CM | POA: Insufficient documentation

## 2011-06-19 DIAGNOSIS — F3289 Other specified depressive episodes: Secondary | ICD-10-CM | POA: Insufficient documentation

## 2011-06-19 DIAGNOSIS — Z87440 Personal history of urinary (tract) infections: Secondary | ICD-10-CM | POA: Insufficient documentation

## 2011-06-19 DIAGNOSIS — F17200 Nicotine dependence, unspecified, uncomplicated: Secondary | ICD-10-CM | POA: Insufficient documentation

## 2011-06-19 DIAGNOSIS — W208XXA Other cause of strike by thrown, projected or falling object, initial encounter: Secondary | ICD-10-CM | POA: Insufficient documentation

## 2011-06-19 MED ORDER — HYDROCODONE-ACETAMINOPHEN 5-325 MG PO TABS
1.0000 | ORAL_TABLET | Freq: Once | ORAL | Status: AC
  Administered 2011-06-19: 1 via ORAL
  Filled 2011-06-19: qty 1

## 2011-06-19 MED ORDER — IBUPROFEN 800 MG PO TABS
800.0000 mg | ORAL_TABLET | Freq: Once | ORAL | Status: AC
  Administered 2011-06-19: 800 mg via ORAL
  Filled 2011-06-19: qty 1

## 2011-06-19 MED ORDER — ONDANSETRON 8 MG PO TBDP
8.0000 mg | ORAL_TABLET | Freq: Once | ORAL | Status: AC
  Administered 2011-06-19: 8 mg via ORAL
  Filled 2011-06-19: qty 1

## 2011-06-19 NOTE — Discharge Instructions (Signed)
Musculoskeletal Injuries Proper early treatment and rehabilitation leads to a quicker recovery for most musculoskletal injuries.   Rest. Rest the injury until movement is no longer painful. Using an injured joint or muscle will prolong the problem.   Elevate. Keep the injured area elevated until most of the swelling and pain are gone. If possible, keep the injured area above the level of your heart.   Ice. Use ice packs directly on the injury for 3 to 4 days.   Rehabilitation. This should begin as soon as the swelling and pain of your injury subside, and as directed by your caregiver. It includes exercises to improve joint motion and muscular strength. Occasionally special braces, splints, or orthotics are used to protect against further injury when you return to your normal activity.  Keeping a positive attitude will help you heal your injury more rapidly and completely. You may return to physical exercise that does not cause pain or increase the risk of re-injury or as directed. This will help maintain fitness. It will also improve your mental attitude. Do not overuse your injured extremity. This will lead to discomfort and may delay full recovery.  Document Released: 02/02/2004 Document Revised: 12/14/2010 Document Reviewed: 06/22/2008 [facility] Patient Information 2012 [place], [place].

## 2011-06-19 NOTE — ED Provider Notes (Signed)
History     CSN: [id number]  Arrival date & time 06/19/11  1504   First MD Initiated Contact with Patient 06/19/11 1512      Chief Complaint  Patient presents with  . Foot Injury    dropped end table on top of R foot an hour ago per Pt.    (Consider location/radiation/quality/duration/timing/severity/associated sxs/prior treatment) HPI Patient is a 26 yo female who presents today complaining of right foot pain that she rates as a 10/10 since a night table dropped on her foot while moving 90 minutes PTA.  Patient reports the pain is sharp and aching and radiates from her foot all the way up to her knee though she denies any injury other than at the foot itself.  She denies any other injuries and pain is worse with movement and palpation.  She used ice without relief before arrival.  There are no other associated or modifying factors. Past Medical History  Diagnosis Date  . Depression   . History of ovarian cyst     left  . Frequent UTI     Past Surgical History  Procedure Date  . Tonsillectomy   . Hand surgery     cyst removed from left hand  . Wisdom tooth extraction   . Colon polyps extracted 2009  . Hand surgery 2005  . Cesarean section 10/12/2010    Procedure: CESAREAN SECTION;  Surgeon: Fortino Sic, MD;  Location: [facility];  Service: Gynecology;  Laterality: N/A;  Twins    Family History  Problem Relation Age of Onset  . Cancer Paternal Grandmother     colon  . Cancer Paternal Grandfather     prostate  . Anesthesia problems Neg Hx   . Hypotension Neg Hx   . Malignant hyperthermia Neg Hx   . Pseudochol deficiency Neg Hx     History  Substance Use Topics  . Smoking status: Current Everyday Smoker -- 0.5 packs/day for 10 years    Types: Cigarettes  . Smokeless tobacco: Not on file  . Alcohol Use: No    OB History    Grav Para Term Preterm Abortions TAB SAB Ect Mult Living   3 2 1 1 1  0 1 0 1 3      Review of Systems  Constitutional: Negative.     HENT: Negative.   Eyes: Negative.   Respiratory: Negative.   Cardiovascular: Negative.   Gastrointestinal: Negative.   Musculoskeletal:       Right foot pain  Skin: Positive for wound.       Slight abrasion over dorsum of right foot  Neurological: Negative.   Hematological: Negative.   Psychiatric/Behavioral: Negative.   All other systems reviewed and are negative.    Allergies  Review of patient's allergies indicates no known allergies.  Home Medications   Current Outpatient Rx  Name Route Sig Dispense Refill  . ACETAMINOPHEN 500 MG PO TABS Oral Take 1,000 mg by mouth every 6 (six) hours as needed. For pain     . IBUPROFEN 800 MG PO TABS Oral Take 800 mg by mouth every 8 (eight) hours as needed. For pain       BP 121/72  Pulse 88  Temp 97.9 F (36.6 C)  Resp 18  Ht 5\' 2"  (1.575 m)  Wt 240 lb (108.863 kg)  BMI 43.90 kg/m2  SpO2 99%  LMP 04/19/2011  Breastfeeding? No  Physical Exam  Nursing note and vitals reviewed. GEN: Well-developed, well-nourished female in no distress  HEENT: Atraumatic, normocephalic.  EYES: PERRLA BL, no scleral icterus. NECK: Trachea midline, no meningismus CV: regular rate and rhythm.  PULM: No respiratory distress.   Neuro: cranial nerves grossly 2-12 intact, no abnormalities of strength or sensation, A and O x 3 MSK: TTP over the dorsum of the right foot in the region of the 2nd and 3rd metatarsals with edema but no gross deformity.  No deformity of the right knee, ankle, or lower leg.  Patient complains of pain here but admits that this is just radiation from her foot and denies any injury to these area.   Skin: No rashes petechiae, purpura, or jaundice. Small crescent shaped abrasion overlying the affected area of tenderness on the dorsum of the right foot. Psych: no abnormality of mood   ED Course  Procedures (including critical care time)  Labs Reviewed - No data to display Dg Foot Complete Right  06/19/2011  *RADIOLOGY REPORT*   Clinical Data: History of injury and pain.  Bruising around first and second metatarsal area.  RIGHT FOOT COMPLETE - 3+ VIEW  Comparison: 03/31/2009.  Findings: There is mild dorsal soft tissue swelling.  Alignment is normal.  No fracture or dislocation is seen.  IMPRESSION: Mild soft tissue swelling.  No fracture or dislocation.  Original Report Authenticated By: Crawford Givens, M.D.     1. Right foot injury       MDM  Patient was evaluated by myself.  I had low suspicion of fracture but patient reports being unable to bear weight.  She had no H and P factors to suggest ankle sprain, fracture, or other extremity injury.  Plain film of the right foot was performed and patient was given an ice pack, ibuprofen, and one tab of vicodin.  4:16 PM Plain film returned and was reviewed by myself.  This was negative for fracture.  Patient was notified of results and told to take OTC meds for pain control and continue using ice.  She was discharged in good condition.       Cyndra Numbers, MD 06/19/11 [phone]

## 2011-06-19 NOTE — ED Notes (Signed)
Pt. Reports she is hurting due to r foot injury.  Pt. Able to move toes on R foot but walking hurts the R foot.

## 2011-10-18 ENCOUNTER — Other Ambulatory Visit (HOSPITAL_COMMUNITY)
Admission: RE | Admit: 2011-10-18 | Discharge: 2011-10-18 | Disposition: A | Discharge status: Home / Self Care | Payer: Medicaid Other | Source: Ambulatory Visit | Attending: Obstetrics and Gynecology | Admitting: Obstetrics and Gynecology

## 2011-10-18 ENCOUNTER — Other Ambulatory Visit: Payer: Self-pay | Admitting: Obstetrics and Gynecology

## 2011-10-18 DIAGNOSIS — Z113 Encounter for screening for infections with a predominantly sexual mode of transmission: Secondary | ICD-10-CM | POA: Insufficient documentation

## 2011-10-18 DIAGNOSIS — Z3480 Encounter for supervision of other normal pregnancy, unspecified trimester: Secondary | ICD-10-CM | POA: Insufficient documentation

## 2011-10-18 DIAGNOSIS — Z124 Encounter for screening for malignant neoplasm of cervix: Secondary | ICD-10-CM | POA: Insufficient documentation

## 2012-12-26 ENCOUNTER — Emergency Department (HOSPITAL_BASED_OUTPATIENT_CLINIC_OR_DEPARTMENT_OTHER)
Admission: EM | Admit: 2012-12-26 | Discharge: 2012-12-26 | Disposition: A | Discharge status: Home / Self Care | Payer: Medicaid Other | Attending: Emergency Medicine | Admitting: Emergency Medicine

## 2012-12-26 ENCOUNTER — Emergency Department (HOSPITAL_BASED_OUTPATIENT_CLINIC_OR_DEPARTMENT_OTHER): Payer: Medicaid Other

## 2012-12-26 ENCOUNTER — Encounter (HOSPITAL_BASED_OUTPATIENT_CLINIC_OR_DEPARTMENT_OTHER): Payer: Self-pay | Admitting: Emergency Medicine

## 2012-12-26 DIAGNOSIS — R079 Chest pain, unspecified: Secondary | ICD-10-CM | POA: Insufficient documentation

## 2012-12-26 DIAGNOSIS — Z8659 Personal history of other mental and behavioral disorders: Secondary | ICD-10-CM | POA: Insufficient documentation

## 2012-12-26 DIAGNOSIS — Z3202 Encounter for pregnancy test, result negative: Secondary | ICD-10-CM | POA: Insufficient documentation

## 2012-12-26 DIAGNOSIS — R059 Cough, unspecified: Secondary | ICD-10-CM | POA: Insufficient documentation

## 2012-12-26 DIAGNOSIS — R509 Fever, unspecified: Secondary | ICD-10-CM | POA: Insufficient documentation

## 2012-12-26 DIAGNOSIS — IMO0001 Reserved for inherently not codable concepts without codable children: Secondary | ICD-10-CM | POA: Insufficient documentation

## 2012-12-26 DIAGNOSIS — R197 Diarrhea, unspecified: Secondary | ICD-10-CM | POA: Insufficient documentation

## 2012-12-26 DIAGNOSIS — Z87440 Personal history of urinary (tract) infections: Secondary | ICD-10-CM | POA: Insufficient documentation

## 2012-12-26 DIAGNOSIS — E860 Dehydration: Secondary | ICD-10-CM | POA: Insufficient documentation

## 2012-12-26 DIAGNOSIS — R112 Nausea with vomiting, unspecified: Secondary | ICD-10-CM | POA: Insufficient documentation

## 2012-12-26 DIAGNOSIS — R51 Headache: Secondary | ICD-10-CM | POA: Insufficient documentation

## 2012-12-26 DIAGNOSIS — F17200 Nicotine dependence, unspecified, uncomplicated: Secondary | ICD-10-CM | POA: Insufficient documentation

## 2012-12-26 DIAGNOSIS — R05 Cough: Secondary | ICD-10-CM | POA: Insufficient documentation

## 2012-12-26 DIAGNOSIS — Z8742 Personal history of other diseases of the female genital tract: Secondary | ICD-10-CM | POA: Insufficient documentation

## 2012-12-26 DIAGNOSIS — R5381 Other malaise: Secondary | ICD-10-CM | POA: Insufficient documentation

## 2012-12-26 LAB — CBC WITH DIFFERENTIAL/PLATELET
Basophils Absolute: 0 10*3/uL (ref 0.0–0.1)
Basophils Relative: 0 % (ref 0–1)
MCHC: 34.4 g/dL (ref 30.0–36.0)
Neutro Abs: 7.1 10*3/uL (ref 1.7–7.7)
Neutrophils Relative %: 75 % (ref 43–77)
Platelets: 231 10*3/uL (ref 150–400)
RDW: 14.3 % (ref 11.5–15.5)

## 2012-12-26 LAB — URINE MICROSCOPIC-ADD ON

## 2012-12-26 LAB — COMPREHENSIVE METABOLIC PANEL
AST: 13 U/L (ref 0–37)
Albumin: 3.7 g/dL (ref 3.5–5.2)
Alkaline Phosphatase: 68 U/L (ref 39–117)
Chloride: 102 mEq/L (ref 96–112)
Potassium: 3.5 mEq/L (ref 3.5–5.1)
Total Bilirubin: 0.6 mg/dL (ref 0.3–1.2)

## 2012-12-26 LAB — URINALYSIS, ROUTINE W REFLEX MICROSCOPIC
Glucose, UA: NEGATIVE mg/dL
Hgb urine dipstick: NEGATIVE
Ketones, ur: NEGATIVE mg/dL
Protein, ur: NEGATIVE mg/dL
pH: 7 (ref 5.0–8.0)

## 2012-12-26 LAB — PREGNANCY, URINE: Preg Test, Ur: NEGATIVE

## 2012-12-26 MED ORDER — SODIUM CHLORIDE 0.9 % IV BOLUS (SEPSIS)
1000.0000 mL | Freq: Once | INTRAVENOUS | Status: AC
  Administered 2012-12-26: 1000 mL via INTRAVENOUS

## 2012-12-26 MED ORDER — ACETAMINOPHEN 500 MG PO TABS
1000.0000 mg | ORAL_TABLET | Freq: Once | ORAL | Status: AC
  Administered 2012-12-26: 1000 mg via ORAL
  Filled 2012-12-26: qty 2

## 2012-12-26 MED ORDER — ONDANSETRON HCL 4 MG/2ML IJ SOLN
4.0000 mg | Freq: Once | INTRAMUSCULAR | Status: AC
  Administered 2012-12-26: 4 mg via INTRAVENOUS
  Filled 2012-12-26: qty 2

## 2012-12-26 MED ORDER — ONDANSETRON HCL 8 MG PO TABS: 8.0000 mg | ORAL_TABLET | Freq: Three times a day (TID) | ORAL | Status: DC | PRN

## 2012-12-26 NOTE — ED Notes (Signed)
Cough, fever, and vomiting. States she has been coughing for a month. Secretions are green.

## 2012-12-26 NOTE — ED Provider Notes (Signed)
CSN: [id number]     Arrival date & time 12/26/12  2010 History  This chart was scribed for Doug Sou, MD by Luisa Dago, ED Scribe. This patient was seen in room MH02/MH02 and the patient's care was started at 8:50 PM.   Chief Complaint  Patient presents with  . Cough  . Emesis  . Fever    Patient is a 27 y.o. female presenting with cough. The history is provided by the patient. No language interpreter was used.  Cough Cough characteristics:  Productive Sputum characteristics:  Yellow Severity:  Moderate Onset quality:  Gradual Duration:  4 weeks Timing:  Intermittent Progression:  Worsening Chronicity:  New Smoker: yes   Relieved by: OTC with mild relief. Worsened by:  Deep breathing and smoking Associated symptoms: chest pain (secondary to cough), fever, headaches and myalgias   Associated symptoms: no sore throat    HPI Comments: Nicole Bond is a 27 y.o. female who presents to the Emergency Department complaining of a  cough that started 1 month ago. Pt is complaining of associated fatigue, HA and myalgias over the past few days. She also reports nausea, 3 episodes of diarrhea and 2 episodes of emesis onset today. Pt reports taking OTC medication with mild relief of her cough. She also states that she had a fever with a TMAX of 100. ED temp is now 98.7. Pt reports associated chest pain secondary to the cough. She states that she has not taken any antiemetics. Pt does not take any daily medications. Pt denies sore throat. Pt is a current everyday smoker. LNMP 1 month ago. Pt denies any medication allergies.  Past Medical History  Diagnosis Date  . Depression   . History of ovarian cyst     left  . Frequent UTI    Past Surgical History  Procedure Laterality Date  . Tonsillectomy    . Hand surgery      cyst removed from left hand  . Wisdom tooth extraction    . Colon polyps extracted  2009  . Hand surgery  2005  . Cesarean section  10/12/2010    Procedure:  CESAREAN SECTION;  Surgeon: Fortino Sic, MD;  Location: [facility];  Service: Gynecology;  Laterality: N/A;  Twins   Family History  Problem Relation Age of Onset  . Cancer Paternal Grandmother     colon  . Cancer Paternal Grandfather     prostate  . Anesthesia problems Neg Hx   . Hypotension Neg Hx   . Malignant hyperthermia Neg Hx   . Pseudochol deficiency Neg Hx    History  Substance Use Topics  . Smoking status: Current Every Day Smoker -- 0.50 packs/day for 10 years    Types: Cigarettes  . Smokeless tobacco: Not on file  . Alcohol Use: No   OB History   Grav Para Term Preterm Abortions TAB SAB Ect Mult Living   3 2 1 1 1  0 1 0 1 3     Review of Systems  Constitutional: Positive for fever and fatigue.  HENT: Negative for sore throat.   Respiratory: Positive for cough.   Cardiovascular: Positive for chest pain (secondary to cough).       Diffuse chest pain with cough  Gastrointestinal: Positive for nausea, vomiting and diarrhea.  Musculoskeletal: Positive for myalgias.  Skin: Negative.   Neurological: Positive for headaches.  Psychiatric/Behavioral: Negative.     Allergies  Review of patient's allergies indicates no known allergies.  Home Medications  No  current outpatient prescriptions on file.  BP 102/67  Pulse 95  Temp(Src) 98.7 F (37.1 C) (Oral)  Resp 18  Ht 5\' 2"  (1.575 m)  Wt 240 lb (108.863 kg)  BMI 43.89 kg/m2  SpO2 98%  LMP 11/08/2012  Physical Exam  Nursing note and vitals reviewed. Constitutional: She is oriented to person, place, and time. She appears well-developed and well-nourished.  HENT:  Head: Normocephalic and atraumatic.  Edentulous. Mucous membranes dry bilateral tympanic membranes normal  Eyes: Conjunctivae are normal. Pupils are equal, round, and reactive to light.  Neck: Neck supple. No tracheal deviation present. No thyromegaly present.  Cardiovascular: Normal rate and regular rhythm.   No murmur heard. Pulmonary/Chest:  Effort normal and breath sounds normal. No respiratory distress.  Coughing frequently  Abdominal: Soft. Bowel sounds are normal. She exhibits no distension. There is no tenderness.  Musculoskeletal: Normal range of motion. She exhibits no edema and no tenderness.  Lymphadenopathy:    She has no cervical adenopathy.  Neurological: She is alert and oriented to person, place, and time. No cranial nerve deficit. Coordination normal.  Skin: Skin is warm and dry. No rash noted.  Psychiatric: She has a normal mood and affect.    ED Course  Procedures (including critical care time)  DIAGNOSTIC STUDIES: Oxygen Saturation is 98% on RA, normal by my interpretation.    COORDINATION OF CARE: 8:57 PM-Will order CXR and diagnostic lab work. Will also order medication in the ED. Also advised pt to quit smoking. Discussed treatment plan with pt at bedside and pt agreed to plan.   Medications  sodium chloride 0.9 % bolus 1,000 mL (0 mLs Intravenous Stopped 12/26/12 2147)  ondansetron (ZOFRAN) injection 4 mg (4 mg Intravenous Given 12/26/12 2116)  acetaminophen (TYLENOL) tablet 1,000 mg (1,000 mg Oral Given 12/26/12 2117)    Labs Review Labs Reviewed  COMPREHENSIVE METABOLIC PANEL - Abnormal; Notable for the following:    Glucose, Bld 107 (*)    All other components within normal limits  URINALYSIS, ROUTINE W REFLEX MICROSCOPIC - Abnormal; Notable for the following:    Color, Urine AMBER (*)    APPearance CLOUDY (*)    Bilirubin Urine SMALL (*)    Urobilinogen, UA 2.0 (*)    Leukocytes, UA MODERATE (*)    All other components within normal limits  URINE MICROSCOPIC-ADD ON - Abnormal; Notable for the following:    Squamous Epithelial / LPF FEW (*)    Bacteria, UA FEW (*)    All other components within normal limits  URINE CULTURE  CBC WITH DIFFERENTIAL  PREGNANCY, URINE   Imaging Review Dg Chest 2 View  12/26/2012   CLINICAL DATA:  Fifty, headache, cough.  EXAM: CHEST - 2 VIEW   COMPARISON:  None available  FINDINGS: The heart size and mediastinal contours are within normal limits. Both lungs are clear. The visualized skeletal structures are unremarkable. No effusion.  IMPRESSION: No acute cardiopulmonary disease.   Electronically Signed   By: Oley Balm M.D.   On: 12/26/2012 21:42    EKG Interpretation   None      11:20 PM Feels much improved the treatment with intravenous fluids, Zofran, Tylenol. Able to drink without difficulty and without nausea. Chest x-ray viewed by me\ Results for orders placed during the hospital encounter of 12/26/12  CBC WITH DIFFERENTIAL      Result Value Range   WBC 9.5  4.0 - 10.5 K/uL   RBC 4.85  3.87 - 5.11 MIL/uL  Hemoglobin 14.5  12.0 - 15.0 g/dL   HCT 16.1  09.6 - 04.5 %   MCV 86.8  78.0 - 100.0 fL   MCH 29.9  26.0 - 34.0 pg   MCHC 34.4  30.0 - 36.0 g/dL   RDW 40.9  81.1 - 91.4 %   Platelets 231  150 - 400 K/uL   Neutrophils Relative % 75  43 - 77 %   Neutro Abs 7.1  1.7 - 7.7 K/uL   Lymphocytes Relative 18  12 - 46 %   Lymphs Abs 1.7  0.7 - 4.0 K/uL   Monocytes Relative 6  3 - 12 %   Monocytes Absolute 0.5  0.1 - 1.0 K/uL   Eosinophils Relative 1  0 - 5 %   Eosinophils Absolute 0.1  0.0 - 0.7 K/uL   Basophils Relative 0  0 - 1 %   Basophils Absolute 0.0  0.0 - 0.1 K/uL  COMPREHENSIVE METABOLIC PANEL      Result Value Range   Sodium 137  135 - 145 mEq/L   Potassium 3.5  3.5 - 5.1 mEq/L   Chloride 102  96 - 112 mEq/L   CO2 23  19 - 32 mEq/L   Glucose, Bld 107 (*) 70 - 99 mg/dL   BUN 10  6 - 23 mg/dL   Creatinine, Ser 7.82  0.50 - 1.10 mg/dL   Calcium 8.5  8.4 - 95.6 mg/dL   Total Protein 7.6  6.0 - 8.3 g/dL   Albumin 3.7  3.5 - 5.2 g/dL   AST 13  0 - 37 U/L   ALT 8  0 - 35 U/L   Alkaline Phosphatase 68  39 - 117 U/L   Total Bilirubin 0.6  0.3 - 1.2 mg/dL   GFR calc non Af Amer >90  >90 mL/min   GFR calc Af Amer >90  >90 mL/min  URINALYSIS, ROUTINE W REFLEX MICROSCOPIC      Result Value Range   Color,  Urine AMBER (*) YELLOW   APPearance CLOUDY (*) CLEAR   Specific Gravity, Urine 1.027  1.005 - 1.030   pH 7.0  5.0 - 8.0   Glucose, UA NEGATIVE  NEGATIVE mg/dL   Hgb urine dipstick NEGATIVE  NEGATIVE   Bilirubin Urine SMALL (*) NEGATIVE   Ketones, ur NEGATIVE  NEGATIVE mg/dL   Protein, ur NEGATIVE  NEGATIVE mg/dL   Urobilinogen, UA 2.0 (*) 0.0 - 1.0 mg/dL   Nitrite NEGATIVE  NEGATIVE   Leukocytes, UA MODERATE (*) NEGATIVE  PREGNANCY, URINE      Result Value Range   Preg Test, Ur NEGATIVE  NEGATIVE  URINE MICROSCOPIC-ADD ON      Result Value Range   Squamous Epithelial / LPF FEW (*) RARE   WBC, UA 7-10  <3 WBC/hpf   Bacteria, UA FEW (*) RARE   Dg Chest 2 View  12/26/2012   CLINICAL DATA:  Fifty, headache, cough.  EXAM: CHEST - 2 VIEW  COMPARISON:  None available  FINDINGS: The heart size and mediastinal contours are within normal limits. Both lungs are clear. The visualized skeletal structures are unremarkable. No effusion.  IMPRESSION: No acute cardiopulmonary disease.   Electronically Signed   By: Oley Balm M.D.   On: 12/26/2012 21:42    MDM  No diagnosis found.  Plan: urine sent for culture.Rx zofran . Tylenol for aches . Imodium for diarrhea, avoid dairt Will not treat with antibiotic as pt with diarrhea   And no  urinary sx. coucilled pt for 5 minute on smoking cessation. Suspect viral  ilness Dx #1 nausea, vomiting and diarrhea #2 mild dehydration #3chronic cough #4 tobacco abuse   I personally performed the services described in this documentation, which was scribed in my presence. The recorded information has been reviewed and considered.   Doug Sou, MD 12/26/12 2329

## 2012-12-27 LAB — URINE CULTURE

## 2012-12-29 ENCOUNTER — Telehealth (HOSPITAL_BASED_OUTPATIENT_CLINIC_OR_DEPARTMENT_OTHER): Payer: Self-pay | Admitting: *Deleted

## 2012-12-29 NOTE — ED Notes (Signed)
Chart sent to EDP office for review °

## 2013-01-07 NOTE — ED Notes (Signed)
Patient called requesting medication for UTI .

## 2013-01-07 NOTE — ED Notes (Signed)
Chart returned from EDP office and she did not feel like patient needed any medication.

## 2013-11-09 ENCOUNTER — Encounter (HOSPITAL_BASED_OUTPATIENT_CLINIC_OR_DEPARTMENT_OTHER): Payer: Self-pay | Admitting: Emergency Medicine

## 2015-09-08 ENCOUNTER — Emergency Department (HOSPITAL_BASED_OUTPATIENT_CLINIC_OR_DEPARTMENT_OTHER)
Admission: EM | Admit: 2015-09-08 | Discharge: 2015-09-08 | Disposition: A | Discharge status: Home / Self Care | Payer: Medicaid Other | Attending: Emergency Medicine | Admitting: Emergency Medicine

## 2015-09-08 ENCOUNTER — Emergency Department (HOSPITAL_BASED_OUTPATIENT_CLINIC_OR_DEPARTMENT_OTHER): Payer: Medicaid Other

## 2015-09-08 ENCOUNTER — Encounter (HOSPITAL_BASED_OUTPATIENT_CLINIC_OR_DEPARTMENT_OTHER): Payer: Self-pay | Admitting: *Deleted

## 2015-09-08 DIAGNOSIS — R0602 Shortness of breath: Secondary | ICD-10-CM | POA: Insufficient documentation

## 2015-09-08 DIAGNOSIS — F17210 Nicotine dependence, cigarettes, uncomplicated: Secondary | ICD-10-CM | POA: Diagnosis not present

## 2015-09-08 DIAGNOSIS — I82811 Embolism and thrombosis of superficial veins of right lower extremities: Secondary | ICD-10-CM | POA: Diagnosis not present

## 2015-09-08 DIAGNOSIS — M7989 Other specified soft tissue disorders: Secondary | ICD-10-CM | POA: Diagnosis present

## 2015-09-08 LAB — COMPREHENSIVE METABOLIC PANEL
ALBUMIN: 3.7 g/dL (ref 3.5–5.0)
ALK PHOS: 56 U/L (ref 38–126)
ALT: 10 U/L — AB (ref 14–54)
AST: 15 U/L (ref 15–41)
Anion gap: 5 (ref 5–15)
BUN: 7 mg/dL (ref 6–20)
CALCIUM: 8.6 mg/dL — AB (ref 8.9–10.3)
CO2: 27 mmol/L (ref 22–32)
CREATININE: 0.69 mg/dL (ref 0.44–1.00)
Chloride: 106 mmol/L (ref 101–111)
GFR calc Af Amer: >60 mL/min (ref >60)
GFR calc non Af Amer: >60 mL/min (ref >60)
GLUCOSE: 99 mg/dL (ref 65–99)
Potassium: 3.6 mmol/L (ref 3.5–5.1)
SODIUM: 138 mmol/L (ref 135–145)
Total Bilirubin: 0.4 mg/dL (ref 0.3–1.2)
Total Protein: 7 g/dL (ref 6.5–8.1)

## 2015-09-08 LAB — CBC WITH DIFFERENTIAL/PLATELET
BASOS PCT: 0 %
Basophils Absolute: 0 10*3/uL (ref 0.0–0.1)
EOS ABS: 0.2 10*3/uL (ref 0.0–0.7)
Eosinophils Relative: 2 %
HCT: 42 % (ref 36.0–46.0)
HEMOGLOBIN: 14.7 g/dL (ref 12.0–15.0)
Lymphocytes Relative: 33 %
Lymphs Abs: 3.3 10*3/uL (ref 0.7–4.0)
MCH: 30.7 pg (ref 26.0–34.0)
MCHC: 35 g/dL (ref 30.0–36.0)
MCV: 87.7 fL (ref 78.0–100.0)
Monocytes Absolute: 0.6 10*3/uL (ref 0.1–1.0)
Monocytes Relative: 6 %
NEUTROS PCT: 59 %
Neutro Abs: 6 10*3/uL (ref 1.7–7.7)
Platelets: 270 10*3/uL (ref 150–400)
RBC: 4.79 MIL/uL (ref 3.87–5.11)
RDW: 14.1 % (ref 11.5–15.5)
WBC: 10.1 10*3/uL (ref 4.0–10.5)

## 2015-09-08 LAB — TROPONIN I: Troponin I: <0.03 ng/mL (ref <0.03)

## 2015-09-08 LAB — HCG, SERUM, QUALITATIVE: Preg, Serum: NEGATIVE

## 2015-09-08 MED ORDER — MORPHINE SULFATE (PF) 4 MG/ML IV SOLN
4.0000 mg | Freq: Once | INTRAVENOUS | Status: AC
  Administered 2015-09-08: 4 mg via INTRAVENOUS
  Filled 2015-09-08: qty 1

## 2015-09-08 MED ORDER — METHYLPREDNISOLONE SODIUM SUCC 125 MG IJ SOLR
125.0000 mg | Freq: Once | INTRAMUSCULAR | Status: AC
  Administered 2015-09-08: 125 mg via INTRAVENOUS
  Filled 2015-09-08: qty 2

## 2015-09-08 MED ORDER — IPRATROPIUM-ALBUTEROL 0.5-2.5 (3) MG/3ML IN SOLN
3.0000 mL | Freq: Four times a day (QID) | RESPIRATORY_TRACT | Status: DC
  Administered 2015-09-08: 3 mL via RESPIRATORY_TRACT
  Filled 2015-09-08: qty 3

## 2015-09-08 MED ORDER — IOPAMIDOL (ISOVUE-370) INJECTION 76%
100.0000 mL | Freq: Once | INTRAVENOUS | Status: AC | PRN
  Administered 2015-09-08: 100 mL via INTRAVENOUS

## 2015-09-08 MED ORDER — SODIUM CHLORIDE 0.9 % IV BOLUS (SEPSIS)
1000.0000 mL | Freq: Once | INTRAVENOUS | Status: AC
  Administered 2015-09-08: 1000 mL via INTRAVENOUS

## 2015-09-08 NOTE — ED Triage Notes (Signed)
C/o pain in back of right calf x 1 week. Denies taking birth control. Pt is smoker. Also c/o sob today.

## 2015-09-08 NOTE — Discharge Instructions (Signed)
You have a blood blot in one of the surface veins of your right leg.  We did not see a blood clot in your lungs.  Your shortness of breath could be due to anxiety, asthma, or related to your smoking.  Take tylenol and ibuprofen for pain in the leg.  You can also apply hot compresses to the back of the leg.  Wear compression stockings.  Please follow-up with your PCP in the next month.  They will need to repeat an ultrasound of your leg.  If you have chest pain, shortness of breath, or worsening leg pain/swelling, come back to the ED.

## 2015-09-08 NOTE — ED Provider Notes (Signed)
[facility] Provider [facility]   CSN: [id number] Arrival date & time: 09/08/15  0847     History   Chief Complaint Chief Complaint  Patient presents with  . Leg Swelling    HPI Nicole Bond is a 30 y.o. female with 1 week of right posterior calf pain/tenderness and dyspnea.  For the past week, she has had sharp pain on her posterior R calf and popliteal fossa with a palpable "knot".  Initially thought it was a muscle cramp, but has continued.  Pain worse with standing and palpation. Swelling and discoloration of R lower leg.  Then, when she woke up today, she noticed some shortness of breath, chest tightness, and cough.  No wheezing, hemoptysis.  Has been anxious, but thinks the SOB is new and different from her anxiety.  No estrogen use, immobilization, travel.  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  No exercise or injuries.  HPI  Past Medical History:  Diagnosis Date  . Depression   . Frequent UTI   . History of ovarian cyst    left    Patient Active Problem List   Diagnosis Date Noted  . Twin gestation, dichorionic diamniotic 10/09/2010  . Fetal growth restriction - both twins 10/09/2010    Past Surgical History:  Procedure Laterality Date  . CESAREAN SECTION  10/12/2010   Procedure: CESAREAN SECTION;  Surgeon: Fortino SicEleanor E Greene, MD;  Location: [facility];  Service: Gynecology;  Laterality: N/A;  Twins  . colon polyps extracted  2009  . HAND SURGERY     cyst removed from left hand  . HAND SURGERY  2005  . TONSILLECTOMY    . WISDOM TOOTH EXTRACTION      OB History    Gravida Para Term Preterm AB Living   3 2 1 1 1 3    SAB TAB Ectopic Multiple Live Births   1 0 0 1 3       Home Medications    Prior to Admission medications   Medication Sig Start Date End Date Taking? Authorizing Provider  ondansetron (ZOFRAN) 8 MG tablet Take 1 tablet (8 mg total) by mouth every 8 (eight) hours as needed for nausea or vomiting. 12/26/12   Doug SouSam Jacubowitz, MD     Family History Family History  Problem Relation Age of Onset  . Cancer Paternal Grandmother     colon  . Cancer Paternal Grandfather     prostate  . Anesthesia problems Neg Hx   . Hypotension Neg Hx   . Malignant hyperthermia Neg Hx   . Pseudochol deficiency Neg Hx     Social History Social History  Substance Use Topics  . Smoking status: Current Every Day Smoker    Packs/day: 0.50    Years: 10.00    Types: Cigarettes  . Smokeless tobacco: Not on file  . Alcohol use No     Allergies   Review of patient's allergies indicates no known allergies.   Review of Systems Review of Systems   Physical Exam Updated Vital Signs There were no vitals taken for this visit.  Physical Exam   ED Treatments / Results  Labs (all labs ordered are listed, but only abnormal results are displayed) Labs Reviewed - No data to display  EKG  EKG Interpretation None       Radiology [place] Venous Img Lower Unilateral Right  Result Date: 09/08/2015 CLINICAL DATA:  30 year old female with a history of swelling right calf pain for 1 week EXAM: RIGHT LOWER  EXTREMITY VENOUS DOPPLER ULTRASOUND TECHNIQUE: Gray-scale sonography with graded compression, as well as color Doppler and duplex ultrasound were performed to evaluate the lower extremity deep venous systems from the level of the common femoral vein and including the common femoral, femoral, profunda femoral, popliteal and calf veins including the posterior tibial, peroneal and gastrocnemius veins when visible. The superficial great saphenous vein was also interrogated. Spectral Doppler was utilized to evaluate flow at rest and with distal augmentation maneuvers in the common femoral, femoral and popliteal veins. COMPARISON:  None. FINDINGS: Contralateral Common Femoral Vein: Respiratory phasicity is normal and symmetric with the symptomatic side. No evidence of thrombus. Normal compressibility. Common Femoral Vein: No evidence of thrombus.  Normal compressibility, respiratory phasicity and response to augmentation. Saphenofemoral Junction: No evidence of thrombus. Normal compressibility and flow on color Doppler imaging. Profunda Femoral Vein: No evidence of thrombus. Normal compressibility and flow on color Doppler imaging. Femoral Vein: No evidence of thrombus. Normal compressibility, respiratory phasicity and response to augmentation. Popliteal Vein: No evidence of thrombus. Normal compressibility, respiratory phasicity and response to augmentation. Calf Veins: No evidence of thrombus. Normal compressibility and flow on color Doppler imaging. Small saphenous vein thrombosis in the distal thigh extending to the ankle. Patent great saphenous vein. Other Findings:  None. IMPRESSION: Sonographic survey of the right lower extremity negative for DVT. Evidence of superficial thrombophlebitis involving the lesser saphenous vein at the posterior distal thigh and posterior calf extending to the ankle. Signed, Yvone Neu. Loreta Ave, DO [facility] Radiology Electronically Signed   By: Gilmer Mor D.O.   On: 09/08/2015 10:56    Procedures Procedures (including critical care time)  Medications Ordered in ED Medications - No data to display   Initial Impression / Assessment and Plan / ED Course  I have reviewed the triage vital signs and the nursing notes.  Pertinent labs & imaging results that were available during my care of the patient were reviewed by me and considered in my medical decision making (see chart for details).  Concern for LLE DVT (Wells 2 for DVT, high probability) and PE (Wells 3) with leg pain and swelling followed by new dyspnea.  Other considerations for her leg pain and palpable mass include trauma and spasm, with anxiety a potential cause of dyspnea.  Other considerations include asthma, COPD, or PNA, but less likely with clear lungs and no fever. -CBC -CMP -Trop -EKG -LLE  DVT U/S -CT PE  Clinical Course  Comment By Time  Small saphenous vein thrombosis. Alm Bustard, MD 08/31 1112  CT negative for PE Alm Bustard, MD 08/31 1118     Final Clinical Impressions(s) / ED Diagnoses   Final diagnoses:  Thrombosis of right saphenous vein   Superficial thrombosis of small saphenous vein without extension into deep leg veins.  No PE.  Discharge home, treat symptomatically with ibuprofen, tylenol, warm compresses, and compressions stockings.  Follow-up with PCP for repeat US in 1 month.  New Prescriptions New Prescriptions   No medications on file     Alm Bustard, MD 09/08/15 1425    Charlynne Pander, MD 09/08/15 [phone]

## 2015-09-12 ENCOUNTER — Telehealth (HOSPITAL_BASED_OUTPATIENT_CLINIC_OR_DEPARTMENT_OTHER): Payer: Self-pay | Admitting: *Deleted

## 2015-09-12 NOTE — Telephone Encounter (Signed)
Pt called with concerns her leg is still painful and swollen after her ED visit on 09/08/2015. States she can't see her PCP until she gets her new medicaid card. Advised to return to ED as needed per her d/c instructions.

## 2015-09-27 NOTE — ED Provider Notes (Signed)
[facility] Provider Note   CSN: [id number] Arrival date & time: 09/08/15  0847     History   Chief Complaint Chief Complaint  Patient presents with  . Leg Swelling    HPI Nicole Bond is a 30 y.o. female with 1 week of right posterior calf pain/tenderness and dyspnea.  For the past week, she has had sharp pain on her posterior R calf and popliteal fossa with a palpable "knot".  Initially thought it was a muscle cramp, but has continued.  Pain worse with standing and palpation. Swelling and discoloration of R lower leg.  Then, when she woke up today, she noticed some shortness of breath, chest tightness, and cough.  No wheezing, hemoptysis.  Has been anxious, but thinks the SOB is new and different from her anxiety.  No estrogen use, immobilization, travel.  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  No exercise or injuries.  HPI  Past Medical History:  Diagnosis Date  . Depression   . Frequent UTI   . History of ovarian cyst    left    Patient Active Problem List   Diagnosis Date Noted  . Twin gestation, dichorionic diamniotic 10/09/2010  . Fetal growth restriction - both twins 10/09/2010    Past Surgical History:  Procedure Laterality Date  . CESAREAN SECTION  10/12/2010   Procedure: CESAREAN SECTION;  Surgeon: Fortino SicEleanor E Greene, MD;  Location: [facility];  Service: Gynecology;  Laterality: N/A;  Twins  . colon polyps extracted  2009  . HAND SURGERY     cyst removed from left hand  . HAND SURGERY  2005  . TONSILLECTOMY    . WISDOM TOOTH EXTRACTION      OB History    Gravida Para Term Preterm AB Living   3 2 1 1 1 3    SAB TAB Ectopic Multiple Live Births   1 0 0 1 3       Home Medications    Prior to Admission medications   Not on File    Family History Family History  Problem Relation Age of Onset  . Cancer Paternal Grandmother     colon  . Cancer Paternal Grandfather     prostate  . Deep vein thrombosis Mother   . Anesthesia  problems Neg Hx   . Hypotension Neg Hx   . Malignant hyperthermia Neg Hx   . Pseudochol deficiency Neg Hx     Social History Social History  Substance Use Topics  . Smoking status: Current Every Day Smoker    Packs/day: 2.00    Years: 10.00    Types: Cigarettes  . Smokeless tobacco: Never Used  . Alcohol use No     Allergies   Review of patient's allergies indicates no known allergies.   Review of Systems Review of Systems  Constitutional: Negative for chills and fever.  Respiratory: Positive for shortness of breath. Negative for cough and wheezing.   Cardiovascular: Positive for leg swelling. Negative for chest pain.  Gastrointestinal: Negative for abdominal pain, constipation, diarrhea and nausea.  Musculoskeletal: Positive for gait problem and myalgias. Negative for arthralgias.  Skin: Negative for rash and wound.  Neurological: Negative for weakness and headaches.  Hematological: Does not bruise/bleed easily.  Psychiatric/Behavioral: The patient is nervous/anxious.      Physical Exam Updated Vital Signs BP 127/83 (BP Location: Right Arm)   Pulse 75   Temp 99.4 F (37.4 C) (Oral)   Resp 18   Ht 5\' 4"  (1.626  m)   Wt 190 lb (86.2 kg)   LMP 08/01/2015   SpO2 98%   BMI 32.61 kg/m   Physical Exam  Constitutional: She is oriented to person, place, and time. She appears well-developed and well-nourished.  HENT:  Mouth/Throat: Oropharynx is clear and moist.  Eyes: Conjunctivae are normal. No scleral icterus.  Cardiovascular: Normal rate, regular rhythm and normal heart sounds.   Pulmonary/Chest: Effort normal and breath sounds normal. No respiratory distress. She has no wheezes.  Musculoskeletal:  Significant tenderness to palpation of R posterior leg from mid-calf to popliteal fossa  Neurological: She is alert and oriented to person, place, and time.  Skin: Skin is warm and dry.  Psychiatric:  Mood anxious, affect congruent Expressing worry over losing her  job due to medical condition     ED Treatments / Results  Labs (all labs ordered are listed, but only abnormal results are displayed) Labs Reviewed  COMPREHENSIVE METABOLIC PANEL - Abnormal; Notable for the following:       Result Value   Calcium 8.6 (*)    ALT 10 (*)    All other components within normal limits  CBC WITH DIFFERENTIAL/PLATELET  TROPONIN I  HCG, SERUM, QUALITATIVE    EKG  EKG Interpretation None       Radiology US Venous Img Lower Unilateral Right  Result Date: 09/08/2015 CLINICAL DATA:  30 year old female with a history of swelling right calf pain for 1 week EXAM: RIGHT LOWER EXTREMITY VENOUS DOPPLER ULTRASOUND TECHNIQUE: Gray-scale sonography with graded compression, as well as color Doppler and duplex ultrasound were performed to evaluate the lower extremity deep venous systems from the level of the common femoral vein and including the common femoral, femoral, profunda femoral, popliteal and calf veins including the posterior tibial, peroneal and gastrocnemius veins when visible. The superficial great saphenous vein was also interrogated. Spectral Doppler was utilized to evaluate flow at rest and with distal augmentation maneuvers in the common femoral, femoral and popliteal veins. COMPARISON:  None. FINDINGS: Contralateral Common Femoral Vein: Respiratory phasicity is normal and symmetric with the symptomatic side. No evidence of thrombus. Normal compressibility. Common Femoral Vein: No evidence of thrombus. Normal compressibility, respiratory phasicity and response to augmentation. Saphenofemoral Junction: No evidence of thrombus. Normal compressibility and flow on color Doppler imaging. Profunda Femoral Vein: No evidence of thrombus. Normal compressibility and flow on color Doppler imaging. Femoral Vein: No evidence of thrombus. Normal compressibility, respiratory phasicity and response to augmentation. Popliteal Vein: No evidence of thrombus. Normal  compressibility, respiratory phasicity and response to augmentation. Calf Veins: No evidence of thrombus. Normal compressibility and flow on color Doppler imaging. Small saphenous vein thrombosis in the distal thigh extending to the ankle. Patent great saphenous vein. Other Findings:  None. IMPRESSION: Sonographic survey of the right lower extremity negative for DVT. Evidence of superficial thrombophlebitis involving the lesser saphenous vein at the posterior distal thigh and posterior calf extending to the ankle. Signed, Yvone Neu. Loreta Ave, DO [facility] Radiology Electronically Signed   By: Gilmer Mor D.O.   On: 09/08/2015 10:56   CT Angio Chest PE (09/08/2015): IMPRESSION: 1. Negative CT angiogram of the chest. No evidence of pulmonary embolism. 2. Probable residual thymic tissue. No mass is seen in that region. No adenopathy.  Procedures Procedures (including critical care time)  Medications Ordered in ED Medications  methylPREDNISolone sodium succinate (SOLU-MEDROL) 125 mg/2 mL injection 125 mg (125 mg Intravenous Given 09/08/15 0937)  morphine 4 MG/ML injection 4 mg (  4 mg Intravenous Given 09/08/15 0937)  sodium chloride 0.9 % bolus 1,000 mL (0 mLs Intravenous Stopped 09/08/15 1034)  iopamidol (ISOVUE-370) 76 % injection 100 mL (100 mLs Intravenous Contrast Given 09/08/15 1052)     Initial Impression / Assessment and Plan / ED Course  I have reviewed the triage vital signs and the nursing notes.  Pertinent labs & imaging results that were available during my care of the patient were reviewed by me and considered in my medical decision making (see chart for details).  Concern for LLE DVT (Wells 2 for DVT, high probability) and PE (Wells 3) with leg pain and swelling followed by new dyspnea.  Other considerations for her leg pain and palpable mass include trauma and spasm, with anxiety a potential cause of dyspnea.  Other considerations  include asthma, COPD, or PNA, but less likely with clear lungs and no fever. -CBC -CMP -Trop -EKG -LLE DVT U/S -CT PE  Clinical Course  Comment By Time  Small saphenous vein thrombosis. Alm Bustard, MD 08/31 1112  CT negative for PE Alm Bustard, MD 08/31 1118     Final Clinical Impressions(s) / ED Diagnoses   Final diagnoses:  Thrombosis of right saphenous vein   Superficial thrombosis of small saphenous vein without extension into deep leg veins.  No PE.  Discharge home, treat symptomatically with ibuprofen, tylenol, warm compresses, and compressions stockings.  Follow-up with PCP for repeat US in 1 month.  New Prescriptions There are no discharge medications for this patient.    Alm Bustard, MD 09/08/15 1425    Charlynne Pander, MD 09/30/15 [phone]

## 2017-01-12 IMAGING — CT CT ANGIO CHEST
2 of 7 series · 18 of 36 positions shown · IV contrast (isovue)
Comparison: Chest x-ray of 12/26/2012

CLINICAL DATA: Shortness of breath, calf pain, smoking history

EXAM:
CT ANGIOGRAPHY CHEST WITH CONTRAST
TECHNIQUE: Multidetector CT imaging of the chest was performed using the
standard protocol during bolus administration of intravenous
contrast. Multiplanar CT image reconstructions and MIPs were
obtained to evaluate the vascular anatomy.
CONTRAST:  100 cc Isovue 370

[Series 6: pe coronal mpr · coronal · 0.56mm/px · 1 of 116 slices shown]
[im 58/116  mediastinal]
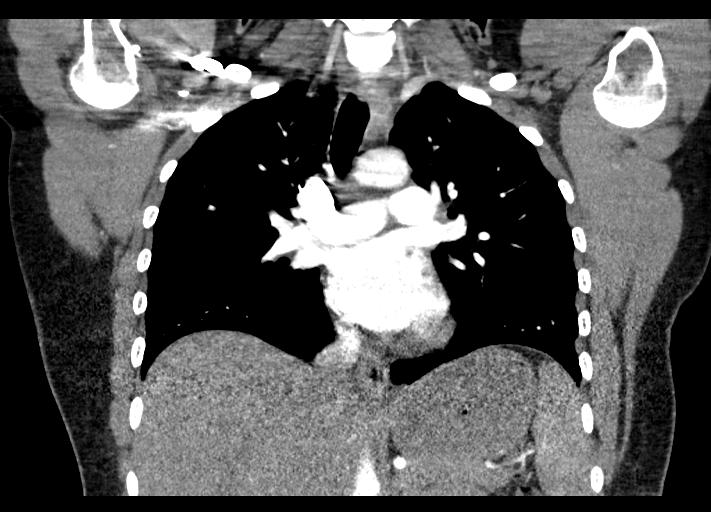

[Series 10: pe thins · axial · 0.92mm/px · z∈[-279,-39]mm · 17 of 268 slices shown]
[im 14/268  lung]
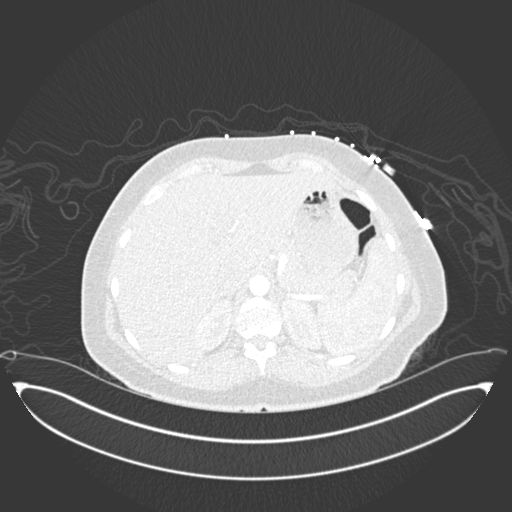
[im 27/268  mediastinal]
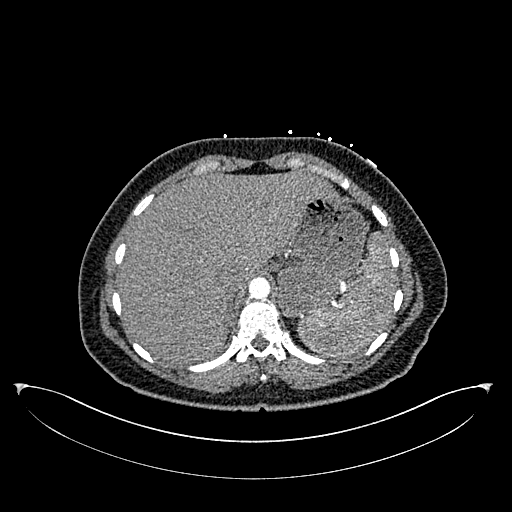
[im 41/268  lung]
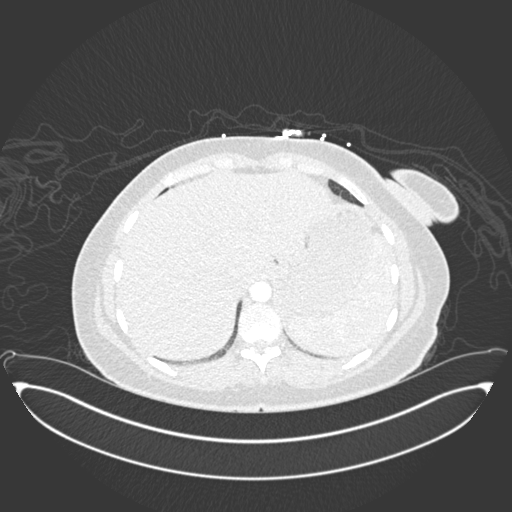
[im 54/268  mediastinal]
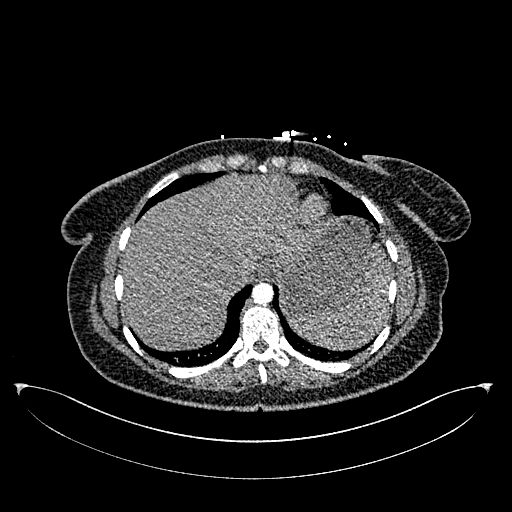
[im 81/268  lung]
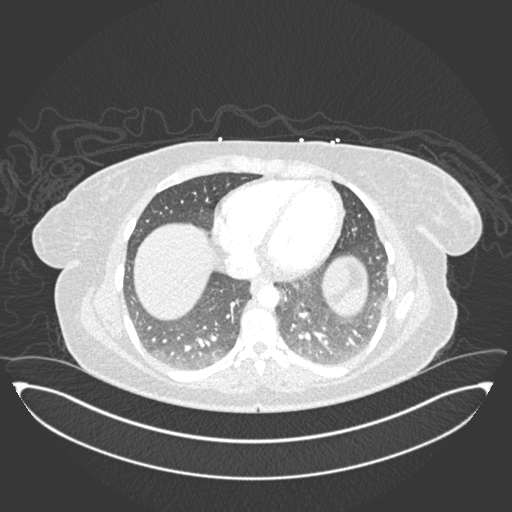
[im 94/268  mediastinal]
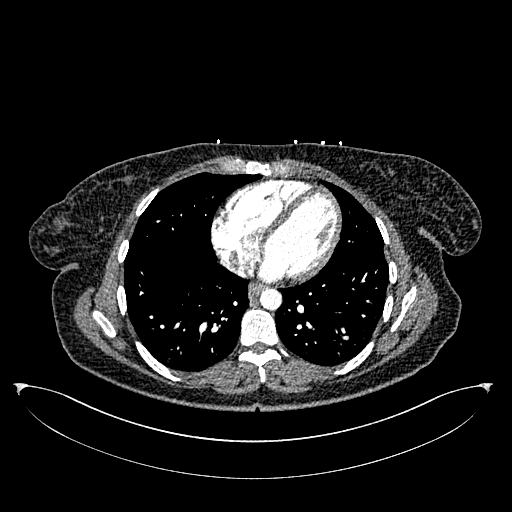
[im 107/268  lung]
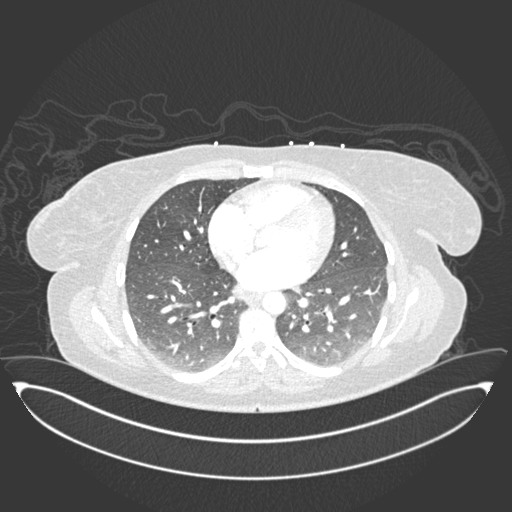
[im 121/268  mediastinal]
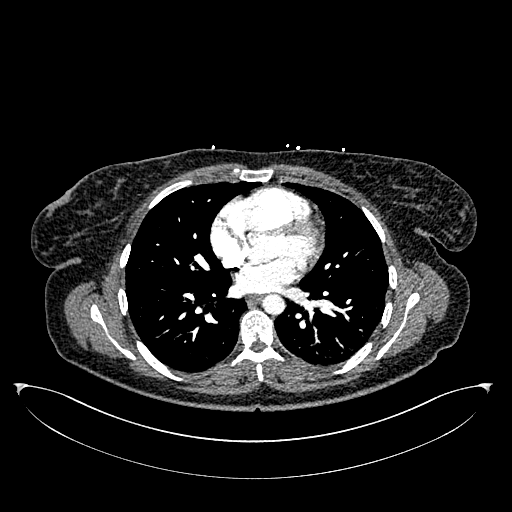
[im 134/268  lung]
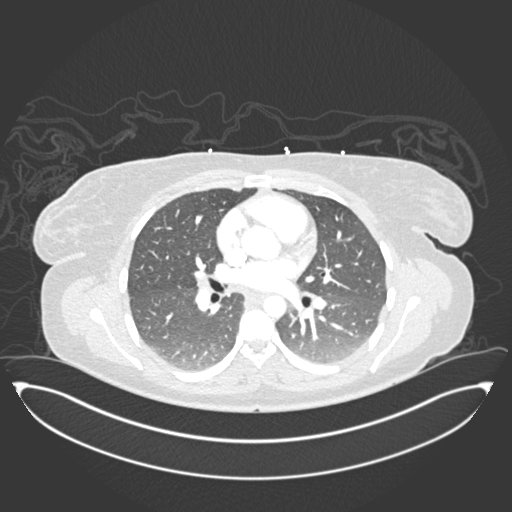
[im 147/268  mediastinal]
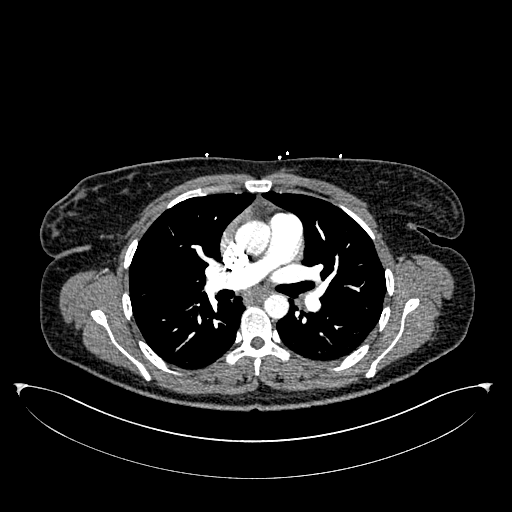
[im 161/268  lung]
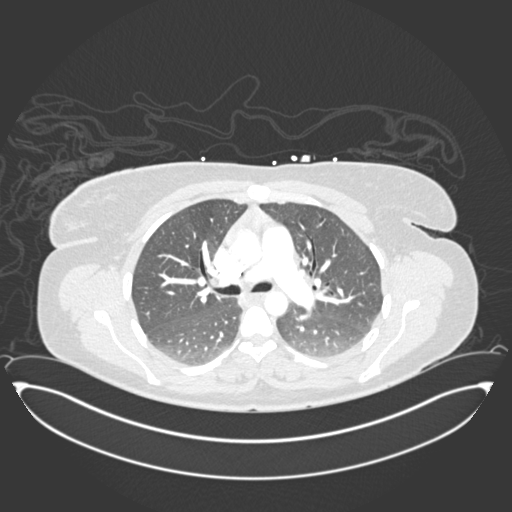
[im 174/268  mediastinal]
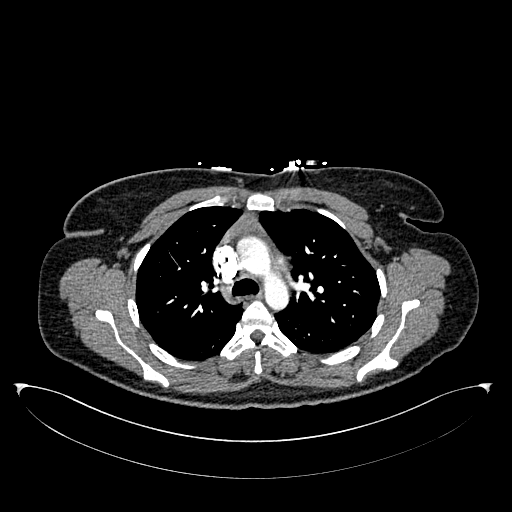
[im 187/268  lung]
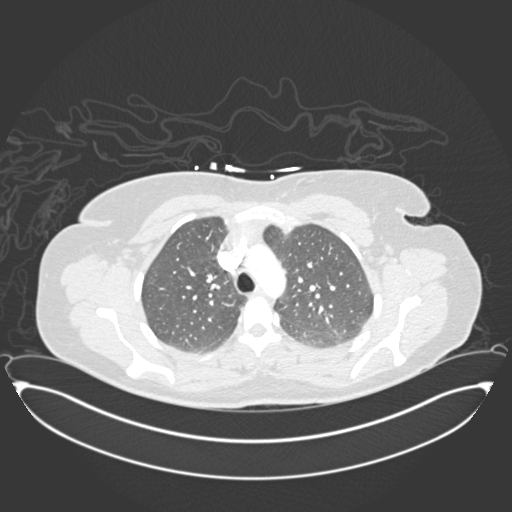
[im 214/268  mediastinal]
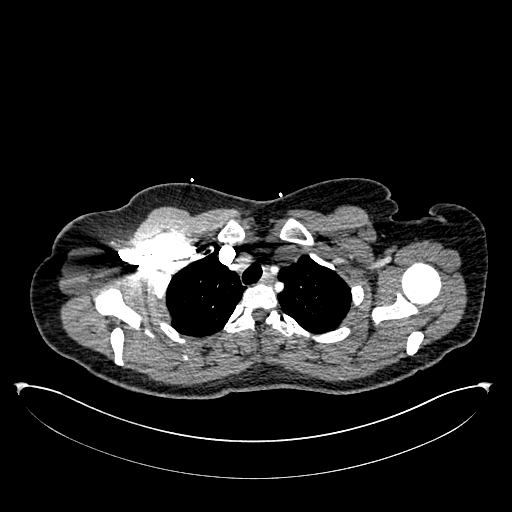
[im 227/268  lung]
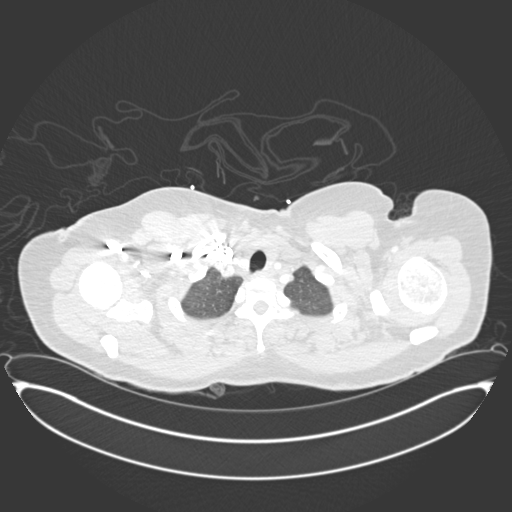
[im 241/268  mediastinal]
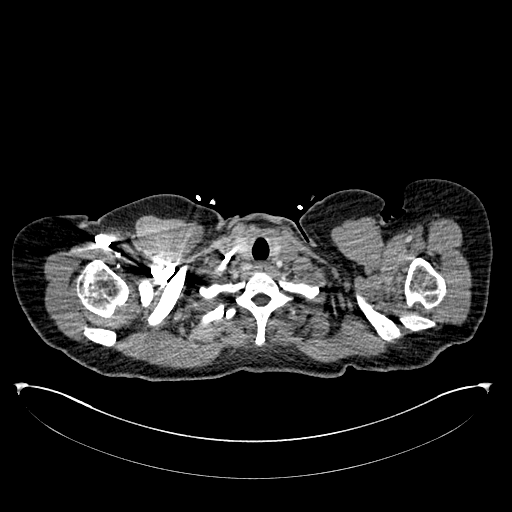
[im 254/268  lung]
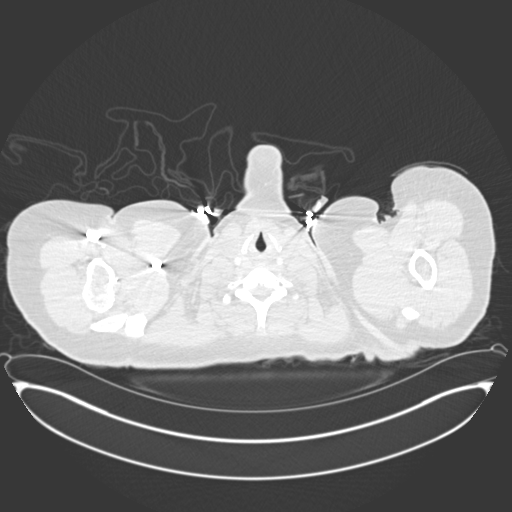

[18 of 36 positions shown; findings below may reference images not displayed]

FINDINGS: The pulmonary arteries are well opacified. There is no evidence of
acute pulmonary embolism. The thoracic aorta also is well opacified
with no acute abnormality noted. The mid ascending thoracic aorta
measures 29 mm in diameter. There is some prominence of soft tissue
within the anterior mediastinum most consistent with residual thymic
tissue. However no mass is seen and this finding is of questionable
significance. No mediastinal or hilar adenopathy is noted. The
thoracic aorta opacifies with no acute abnormality. The portion of
the upper abdomen that is visualized is unremarkable.

On lung window images, no evidence of pneumonia is seen and there is
no evidence of pleural effusion. Small blebs are present in the
posterior lower lobes. The central airway is patent. The thoracic
vertebrae are in normal alignment with normal intervertebral disc
spaces.

Review of the MIP images confirms the above findings.
IMPRESSION: 1. Negative CT angiogram of the chest. No evidence of pulmonary
embolism.
2. Probable residual thymic tissue. No mass is seen in that region.
No adenopathy.

## 2018-07-31 ENCOUNTER — Other Ambulatory Visit: Payer: Self-pay

## 2018-08-18 ENCOUNTER — Other Ambulatory Visit: Payer: Self-pay | Admitting: Hematology

## 2018-08-18 DIAGNOSIS — I824Y9 Acute embolism and thrombosis of unspecified deep veins of unspecified proximal lower extremity: Secondary | ICD-10-CM

## 2018-08-18 DIAGNOSIS — I82409 Acute embolism and thrombosis of unspecified deep veins of unspecified lower extremity: Secondary | ICD-10-CM | POA: Insufficient documentation

## 2018-08-22 ENCOUNTER — Inpatient Hospital Stay: Payer: Medicaid Other

## 2018-08-22 ENCOUNTER — Inpatient Hospital Stay: Payer: Medicaid Other | Attending: Hematology | Admitting: Hematology

## 2018-09-03 ENCOUNTER — Telehealth: Payer: Self-pay | Admitting: Hematology

## 2018-09-03 NOTE — Telephone Encounter (Signed)
Called and spoke with patient about rescheduling her missed appointment.  She was ok with new date/time. Per 8/26 staff message

## 2018-09-03 NOTE — Progress Notes (Deleted)
[name] NOTE  Patient Care Team: Heber Birdseye, Hershal Coria as PCP - General (Internal Medicine)  HEME/ONC OVERVIEW: 1. Hx of DVT and PTE -08/2015: doppler showed superficial thrombophlebitis involving the R lesser saphenous vein extending from the posterior tibial thigh to the ankle  -01/2017: CTA chest reportedly showed two small subsegmental PTE's according to ER notes from [facility]; no formal imaging report   ASSESSMENT & PLAN:   Hx of recurrent DVT and PTE -I reviewed the patient's records in detail, including ER notes from [facility], PCP clinic notes, lab studies and imaging results -In summary, patient was diagnosed with superficial thrombophlebitis involving the R lesser saphenous vein extending from the posterior tibial thigh to the ankle.  She was evaluated in [facility] ER on 01/2016 for pleuritic chest pain.  According to the ER notes, CTA chest showed 2 small subsegmental PTE's. She was discharged from the ER with Xarelto.  -I reviewed with the patient about the plan for care for DVT and PTE. -This last episode of blood clot appeared to be unprovoked.  Given the patient's young age, she would benefit from testing to rule out hypercoagulable disorder.  However, due to her being on anticoagulation, the hypercoagulable tests will be limited due to interference from the blood thinner.   -She is currently tolerating Xarelto well without any abnormal bleeding or bruising -In light of recurrent VTE's, the goal of anticoagulation is lifelong. -Given her young age without any clear risk factors for VTE, I have ordered prothrombin gene mutation, Factor V Leiden, and antiphospholipid syndrome testing  -I recommend the patient to use elastic compression stockings at 20-30 mmHg to reduce risks of chronic thrombophlebitis. -Finally, I reinforced the importance of preventive strategies such as avoiding hormonal supplement, avoiding cigarette  smoking, keeping up-to-date with screening programs for early cancer detection, frequent ambulation for long distance travel and aggressive DVT prophylaxis in all surgical settings. -Should she need any interruption of the anticoagulation for elective procedures in the future, feel free to contact me regarding peri-operative management.  No orders of the defined types were placed in this encounter.   A total of more than {CHL ONC TIME VISIT - BPZWC:[id number]} were spent face-to-face with the patient during this encounter and over half of that time was spent on counseling and coordination of care as outlined above.    All questions were answered. The patient knows to call the clinic with any problems, questions or concerns.  Tish Men, MD 09/03/2018 10:35 AM   CHIEF COMPLAINTS/PURPOSE OF CONSULTATION:  "I am here for ***"  HISTORY OF PRESENTING ILLNESS:  Nicole Bond 33 y.o. female is here because of ***   I have reviewed her chart and materials related to her cancer extensively and collaborated history with the patient. Summary of oncologic history is as follows: Oncology History   No history exists.    MEDICAL HISTORY:  Past Medical History:  Diagnosis Date  . Depression   . Frequent UTI   . History of ovarian cyst    left    SURGICAL HISTORY: Past Surgical History:  Procedure Laterality Date  . CESAREAN SECTION  10/12/2010   Procedure: CESAREAN SECTION;  Surgeon: Avel Sensor, MD;  Location: [facility];  Service: Gynecology;  Laterality: N/A;  Twins  . colon polyps extracted  2009  . HAND SURGERY     cyst removed from left hand  . HAND SURGERY  2005  . TONSILLECTOMY    .  WISDOM TOOTH EXTRACTION      SOCIAL HISTORY: Social History   Socioeconomic History  . Marital status: Married    Spouse name: Not on file  . Number of children: Not on file  . Years of education: Not on file  . Highest education level: Not on file  Occupational History  . Not on file   Social Needs  . Financial resource strain: Not on file  . Food insecurity    Worry: Not on file    Inability: Not on file  . Transportation needs    Medical: Not on file    Non-medical: Not on file  Tobacco Use  . Smoking status: Current Every Day Smoker    Packs/day: 2.00    Years: 10.00    Pack years: 20.00    Types: Cigarettes  . Smokeless tobacco: Never Used  Substance and Sexual Activity  . Alcohol use: No  . Drug use: No  . Sexual activity: Not Currently    Birth control/protection: Pill  Lifestyle  . Physical activity    Days per week: Not on file    Minutes per session: Not on file  . Stress: Not on file  Relationships  . Social Musicianconnections    Talks on phone: Not on file    Gets together: Not on file    Attends religious service: Not on file    Active member of club or organization: Not on file    Attends meetings of clubs or organizations: Not on file    Relationship status: Not on file  . Intimate partner violence    Fear of current or ex partner: Not on file    Emotionally abused: Not on file    Physically abused: Not on file    Forced sexual activity: Not on file  Other Topics Concern  . Not on file  Social History Narrative  . Not on file    FAMILY HISTORY: Family History  Problem Relation Age of Onset  . Cancer Paternal Grandmother        colon  . Cancer Paternal Grandfather        prostate  . Deep vein thrombosis Mother   . Anesthesia problems Neg Hx   . Hypotension Neg Hx   . Malignant hyperthermia Neg Hx   . Pseudochol deficiency Neg Hx     ALLERGIES:  has No Known Allergies.  MEDICATIONS:  No current outpatient medications on file.   No current facility-administered medications for this visit.     REVIEW OF SYSTEMS:   Constitutional: ( - ) fevers, ( - )  chills , ( - ) night sweats Eyes: ( - ) blurriness of vision, ( - ) double vision, ( - ) watery eyes Ears, nose, mouth, throat, and face: ( - ) mucositis, ( - ) sore  throat Respiratory: ( - ) cough, ( - ) dyspnea, ( - ) wheezes Cardiovascular: ( - ) palpitation, ( - ) chest discomfort, ( - ) lower extremity swelling Gastrointestinal:  ( - ) nausea, ( - ) heartburn, ( - ) change in bowel habits Skin: ( - ) abnormal skin rashes Lymphatics: ( - ) new lymphadenopathy, ( - ) easy bruising Neurological: ( - ) numbness, ( - ) tingling, ( - ) new weaknesses Behavioral/Psych: ( - ) mood change, ( - ) new changes  All other systems were reviewed with the patient and are negative.  PHYSICAL EXAMINATION: ECOG PERFORMANCE STATUS: {CHL ONC ECOG NW:[id number]}PS:[phone]}  There were  no vitals filed for this visit. There were no vitals filed for this visit.  GENERAL: alert, no distress and comfortable SKIN: skin color, texture, turgor are normal, no rashes or significant lesions EYES: conjunctiva are pink and non-injected, sclera clear OROPHARYNX: no exudate, no erythema; lips, buccal mucosa, and tongue normal  NECK: supple, non-tender LYMPH:  no palpable lymphadenopathy in the cervical LUNGS: clear to auscultation with normal breathing effort HEART: regular rate & rhythm, no murmurs, no lower extremity edema ABDOMEN: soft, non-tender, non-distended, normal bowel sounds Musculoskeletal: no cyanosis of digits and no clubbing  PSYCH: alert & oriented x 3, fluent speech NEURO: no focal motor/sensory deficits  LABORATORY DATA:  I have reviewed the data as listed Lab Results  Component Value Date   WBC 10.1 09/08/2015   HGB 14.7 09/08/2015   HCT 42.0 09/08/2015   MCV 87.7 09/08/2015   PLT 270 09/08/2015   Lab Results  Component Value Date   NA 138 09/08/2015   K 3.6 09/08/2015   CL 106 09/08/2015   CO2 27 09/08/2015    RADIOGRAPHIC STUDIES: I have personally reviewed the radiological images as listed and agreed with the findings in the report. No results found.  PATHOLOGY: I have reviewed the pathology reports as documented in the oncologist history.

## 2018-09-04 ENCOUNTER — Ambulatory Visit: Payer: Medicaid Other | Admitting: Hematology

## 2018-09-04 ENCOUNTER — Other Ambulatory Visit: Payer: Medicaid Other
